# Patient Record
Sex: Male | Born: 2000 | Race: Black or African American | Hispanic: No | Marital: Single | Smoking: Current every day smoker
Health system: Southern US, Community
[De-identification: ages and names within clinical notes are randomized; demographics above are authoritative.]

## PROBLEM LIST (undated history)

## (undated) DIAGNOSIS — N433 Hydrocele, unspecified: Secondary | ICD-10-CM

## (undated) DIAGNOSIS — Z87820 Personal history of traumatic brain injury: Secondary | ICD-10-CM

---
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
## 2020-05-28 ENCOUNTER — Emergency Department (HOSPITAL_COMMUNITY)
Admission: EM | Admit: 2020-05-28 | Discharge: 2020-05-28 | Disposition: A | Discharge status: Home / Self Care | Payer: Self-pay | Attending: Emergency Medicine | Admitting: Emergency Medicine

## 2020-05-28 ENCOUNTER — Emergency Department (HOSPITAL_COMMUNITY): Payer: Self-pay

## 2020-05-28 ENCOUNTER — Other Ambulatory Visit: Payer: Self-pay

## 2020-05-28 DIAGNOSIS — F1924 Other psychoactive substance dependence with psychoactive substance-induced mood disorder: Secondary | ICD-10-CM | POA: Insufficient documentation

## 2020-05-28 DIAGNOSIS — F1994 Other psychoactive substance use, unspecified with psychoactive substance-induced mood disorder: Secondary | ICD-10-CM

## 2020-05-28 DIAGNOSIS — W19XXXA Unspecified fall, initial encounter: Secondary | ICD-10-CM

## 2020-05-28 DIAGNOSIS — J45909 Unspecified asthma, uncomplicated: Secondary | ICD-10-CM | POA: Insufficient documentation

## 2020-05-28 DIAGNOSIS — S060X1A Concussion with loss of consciousness of 30 minutes or less, initial encounter: Secondary | ICD-10-CM | POA: Insufficient documentation

## 2020-05-28 DIAGNOSIS — F332 Major depressive disorder, recurrent severe without psychotic features: Secondary | ICD-10-CM | POA: Insufficient documentation

## 2020-05-28 DIAGNOSIS — S0101XA Laceration without foreign body of scalp, initial encounter: Secondary | ICD-10-CM | POA: Insufficient documentation

## 2020-05-28 DIAGNOSIS — Z23 Encounter for immunization: Secondary | ICD-10-CM | POA: Insufficient documentation

## 2020-05-28 DIAGNOSIS — S0993XA Unspecified injury of face, initial encounter: Secondary | ICD-10-CM

## 2020-05-28 DIAGNOSIS — Z7951 Long term (current) use of inhaled steroids: Secondary | ICD-10-CM | POA: Insufficient documentation

## 2020-05-28 DIAGNOSIS — Z20822 Contact with and (suspected) exposure to covid-19: Secondary | ICD-10-CM | POA: Insufficient documentation

## 2020-05-28 DIAGNOSIS — F1010 Alcohol abuse, uncomplicated: Secondary | ICD-10-CM

## 2020-05-28 DIAGNOSIS — R45851 Suicidal ideations: Secondary | ICD-10-CM | POA: Insufficient documentation

## 2020-05-28 DIAGNOSIS — F329 Major depressive disorder, single episode, unspecified: Secondary | ICD-10-CM

## 2020-05-28 LAB — CBC
HCT: 47.9 % (ref 39.0–52.0)
Hemoglobin: 16.2 g/dL (ref 13.0–17.0)
MCH: 32.6 pg (ref 26.0–34.0)
MCHC: 33.8 g/dL (ref 30.0–36.0)
MCV: 96.4 fL (ref 80.0–100.0)
Platelets: 356 10*3/uL (ref 150–400)
RBC: 4.97 MIL/uL (ref 4.22–5.81)
RDW: 11.5 % (ref 11.5–15.5)
WBC: 11.4 10*3/uL — ABNORMAL HIGH (ref 4.0–10.5)
nRBC: 0 % (ref 0.0–0.2)

## 2020-05-28 LAB — ETHANOL: Alcohol, Ethyl (B): 212 mg/dL — ABNORMAL HIGH (ref <10)

## 2020-05-28 LAB — BASIC METABOLIC PANEL
Anion gap: 13 (ref 5–15)
BUN: 7 mg/dL (ref 6–20)
CO2: 18 mmol/L — ABNORMAL LOW (ref 22–32)
Calcium: 9.2 mg/dL (ref 8.9–10.3)
Chloride: 110 mmol/L (ref 98–111)
Creatinine, Ser: 1 mg/dL (ref 0.61–1.24)
GFR, Estimated: >60 mL/min (ref >60)
Glucose, Bld: 98 mg/dL (ref 70–99)
Potassium: 3.7 mmol/L (ref 3.5–5.1)
Sodium: 141 mmol/L (ref 135–145)

## 2020-05-28 LAB — RAPID URINE DRUG SCREEN, HOSP PERFORMED
Amphetamines: NOT DETECTED
Barbiturates: NOT DETECTED
Benzodiazepines: NOT DETECTED
Cocaine: NOT DETECTED
Opiates: NOT DETECTED
Tetrahydrocannabinol: NOT DETECTED

## 2020-05-28 LAB — RESP PANEL BY RT-PCR (FLU A&B, COVID) ARPGX2
Influenza A by PCR: NEGATIVE
Influenza B by PCR: NEGATIVE
SARS Coronavirus 2 by RT PCR: NEGATIVE

## 2020-05-28 MED ORDER — ACETAMINOPHEN 325 MG PO TABS: 650.0000 mg | ORAL_TABLET | Freq: Four times a day (QID) | ORAL | Status: DC | PRN

## 2020-05-28 MED ORDER — LORAZEPAM 1 MG PO TABS: 1.0000 mg | ORAL_TABLET | Freq: Four times a day (QID) | ORAL | Status: DC | PRN

## 2020-05-28 MED ORDER — TETANUS-DIPHTH-ACELL PERTUSSIS 5-2.5-18.5 LF-MCG/0.5 IM SUSY
0.5000 mL | PREFILLED_SYRINGE | Freq: Once | INTRAMUSCULAR | Status: AC
  Administered 2020-05-28: 0.5 mL via INTRAMUSCULAR
  Filled 2020-05-28: qty 0.5

## 2020-05-28 NOTE — ED Notes (Signed)
Pt mother, Lamar Laundry at bedside. Pt mother informed this RN and Dr. Bebe Shaggy that pt told sister he has thought about killing himself. To mother's knowledge, pt has not actively tried to harm himself, just drinks an excessive amount. Pt is resting at this time. Pt to see psych team in morning.
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
## 2020-05-28 NOTE — ED Notes (Signed)
Pt mother, Lamar Laundry, would like updates whenever possible. Number is in chart.

## 2020-05-28 NOTE — ED Notes (Signed)
Breakfast Ordered 

## 2020-05-28 NOTE — ED Notes (Signed)
Pt ambulates with steady gait and is drinking water without difficulty.

## 2020-05-28 NOTE — ED Notes (Signed)
Pt to CT

## 2020-05-28 NOTE — ED Notes (Signed)
Ice pack applied to right jaw . Patient was missing his phone , called his mother Lamar Laundry [phone] , she states she took his phone with her.

## 2020-05-28 NOTE — BH Assessment (Signed)
This Clinical research associate spoke to patient's father Conlee Sliter [phone] to gather collateral information with patient's permission in reference to safety concerns if patient were to be discharged. Father states patient has never attempted self harm to his knowledge and has no concerns in reference to patient being discharged this date. Father also reports he has no concerns in reference to patient harming anyone else. Father states patient currently resides with him and does report patient "drinks to much at times" although is uncertain if patient needs counseling but states if patient could be provided with resources on discharge he would encourage patient to follow up with OP care. Father states he will assist in transporting patient back to their residence on discharge.

## 2020-05-28 NOTE — Discharge Instructions (Addendum)
Consider following up with one of the facilities on the attached resource guide, for symptoms of depression, suicidal thoughts, or alcoholism.  Try to avoid all forms of alcohol.  Work on Chief Financial Officer.  Try to eat and drink regularly.

## 2020-05-28 NOTE — ED Triage Notes (Addendum)
Pt presents to ED POV. Pt c/o fall and ETOH. Per sister pt fell and hit back of head. Pt had ground level fall and hit concrete. Pt had bleeding from back of head, ear, and nose. Bleeding is controlled. Unknown LOC. Pt AAO x4, PERRLA.
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
## 2020-05-28 NOTE — ED Notes (Signed)
Pt is too lethargic to get TTS right now. Dr. Bebe Shaggy agrees that we should wait until pt is more awake.

## 2020-05-28 NOTE — ED Notes (Signed)
Pt is sound asleep. Unable to answer psych eval assessment questions at this time.

## 2020-05-28 NOTE — ED Provider Notes (Signed)
Emergency Medicine Observation Re-evaluation Note  Solmon Bohr III is a 20 y.o. male, seen on rounds today.  Pt initially presented to the ED for complaints of Fall Currently, the patient is calm and comfortable.  He has no further complaints.  Physical Exam  BP (!) 130/91 (BP Location: Left Arm)   Pulse 92   Temp 98.2 F (36.8 C) (Oral)   Resp 20   SpO2 96%  Physical Exam General: Alert and calm Cardiac: Normal heart rate Lungs: No respiratory distress Psych: Not responding to internal stimuli  ED Course / MDM  EKG:    I have reviewed the labs performed to date as well as medications administered while in observation.  Recent changes in the last 24 hours include he is calm and comfortable and has been seen by TTS who have cleared him psychiatrically.  We have contacted his family member will, pick him up.  He has been referred for care for alcoholism.  Plan  Current plan is for discharge home, with family member, father. Patient is not under full IVC at this time.   Mancel Bale, MD 05/28/20 [phone]

## 2022-05-17 ENCOUNTER — Emergency Department (HOSPITAL_COMMUNITY)
Admission: EM | Admit: 2022-05-17 | Discharge: 2022-05-19 | Disposition: A | Discharge status: Other Acute Inpatient Hospital | Payer: Federal, State, Local not specified - Other | Attending: Emergency Medicine | Admitting: Emergency Medicine

## 2022-05-17 ENCOUNTER — Other Ambulatory Visit: Payer: Self-pay

## 2022-05-17 ENCOUNTER — Encounter (HOSPITAL_COMMUNITY): Payer: Self-pay

## 2022-05-17 DIAGNOSIS — X781XXA Intentional self-harm by knife, initial encounter: Secondary | ICD-10-CM | POA: Insufficient documentation

## 2022-05-17 DIAGNOSIS — R4589 Other symptoms and signs involving emotional state: Secondary | ICD-10-CM | POA: Diagnosis present

## 2022-05-17 DIAGNOSIS — F1010 Alcohol abuse, uncomplicated: Secondary | ICD-10-CM | POA: Diagnosis present

## 2022-05-17 DIAGNOSIS — Z1152 Encounter for screening for COVID-19: Secondary | ICD-10-CM | POA: Insufficient documentation

## 2022-05-17 DIAGNOSIS — Z23 Encounter for immunization: Secondary | ICD-10-CM | POA: Insufficient documentation

## 2022-05-17 DIAGNOSIS — S61512A Laceration without foreign body of left wrist, initial encounter: Secondary | ICD-10-CM | POA: Insufficient documentation

## 2022-05-17 DIAGNOSIS — Z7289 Other problems related to lifestyle: Secondary | ICD-10-CM

## 2022-05-17 MED ORDER — ACETAMINOPHEN 500 MG PO TABS
1000.0000 mg | ORAL_TABLET | Freq: Once | ORAL | Status: AC
  Administered 2022-05-18: 1000 mg via ORAL
  Filled 2022-05-17: qty 2

## 2022-05-17 MED ORDER — LIDOCAINE-EPINEPHRINE (PF) 2 %-1:200000 IJ SOLN
10.0000 mL | Freq: Once | INTRAMUSCULAR | Status: AC
  Administered 2022-05-18: 10 mL
  Filled 2022-05-17: qty 20

## 2022-05-17 MED ORDER — TETANUS-DIPHTH-ACELL PERTUSSIS 5-2.5-18.5 LF-MCG/0.5 IM SUSY
0.5000 mL | PREFILLED_SYRINGE | Freq: Once | INTRAMUSCULAR | Status: AC
  Administered 2022-05-18: 0.5 mL via INTRAMUSCULAR
  Filled 2022-05-17: qty 0.5
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
## 2022-05-18 ENCOUNTER — Emergency Department (HOSPITAL_COMMUNITY): Payer: Self-pay

## 2022-05-18 DIAGNOSIS — Z7289 Other problems related to lifestyle: Secondary | ICD-10-CM

## 2022-05-18 DIAGNOSIS — F1010 Alcohol abuse, uncomplicated: Secondary | ICD-10-CM

## 2022-05-18 LAB — COMPREHENSIVE METABOLIC PANEL
ALT: 16 U/L (ref 0–44)
AST: 29 U/L (ref 15–41)
Albumin: 4.6 g/dL (ref 3.5–5.0)
Alkaline Phosphatase: 69 U/L (ref 38–126)
Anion gap: 15 (ref 5–15)
BUN: 8 mg/dL (ref 6–20)
CO2: 15 mmol/L — ABNORMAL LOW (ref 22–32)
Calcium: 8.6 mg/dL — ABNORMAL LOW (ref 8.9–10.3)
Chloride: 111 mmol/L (ref 98–111)
Creatinine, Ser: 0.96 mg/dL (ref 0.61–1.24)
GFR, Estimated: >60 mL/min (ref >60)
Glucose, Bld: 87 mg/dL (ref 70–99)
Potassium: 3.9 mmol/L (ref 3.5–5.1)
Sodium: 141 mmol/L (ref 135–145)
Total Bilirubin: 0.7 mg/dL (ref 0.3–1.2)
Total Protein: 8.1 g/dL (ref 6.5–8.1)

## 2022-05-18 LAB — CBC WITH DIFFERENTIAL/PLATELET
Abs Immature Granulocytes: 0.05 10*3/uL (ref 0.00–0.07)
Basophils Absolute: 0.1 10*3/uL (ref 0.0–0.1)
Basophils Relative: 1 %
Eosinophils Absolute: 0.1 10*3/uL (ref 0.0–0.5)
Eosinophils Relative: 1 %
HCT: 47.8 % (ref 39.0–52.0)
Hemoglobin: 16 g/dL (ref 13.0–17.0)
Immature Granulocytes: 1 %
Lymphocytes Relative: 22 %
Lymphs Abs: 2 10*3/uL (ref 0.7–4.0)
MCH: 33.3 pg (ref 26.0–34.0)
MCHC: 33.5 g/dL (ref 30.0–36.0)
MCV: 99.4 fL (ref 80.0–100.0)
Monocytes Absolute: 0.7 10*3/uL (ref 0.1–1.0)
Monocytes Relative: 8 %
Neutro Abs: 6.1 10*3/uL (ref 1.7–7.7)
Neutrophils Relative %: 67 %
Platelets: 386 10*3/uL (ref 150–400)
RBC: 4.81 MIL/uL (ref 4.22–5.81)
RDW: 12.5 % (ref 11.5–15.5)
WBC: 9.1 10*3/uL (ref 4.0–10.5)
nRBC: 0 % (ref 0.0–0.2)

## 2022-05-18 LAB — RESP PANEL BY RT-PCR (RSV, FLU A&B, COVID)  RVPGX2
Influenza A by PCR: NEGATIVE
Influenza B by PCR: NEGATIVE
Resp Syncytial Virus by PCR: NEGATIVE
SARS Coronavirus 2 by RT PCR: NEGATIVE

## 2022-05-18 LAB — SALICYLATE LEVEL: Salicylate Lvl: <7 mg/dL — ABNORMAL LOW (ref 7.0–30.0)

## 2022-05-18 LAB — ETHANOL: Alcohol, Ethyl (B): 265 mg/dL — ABNORMAL HIGH (ref <10)

## 2022-05-18 LAB — RAPID URINE DRUG SCREEN, HOSP PERFORMED
Amphetamines: NOT DETECTED
Barbiturates: NOT DETECTED
Benzodiazepines: NOT DETECTED
Cocaine: NOT DETECTED
Opiates: NOT DETECTED
Tetrahydrocannabinol: NOT DETECTED

## 2022-05-18 LAB — ACETAMINOPHEN LEVEL: Acetaminophen (Tylenol), Serum: <10 ug/mL — ABNORMAL LOW (ref 10–30)

## 2022-05-18 MED ORDER — ACETAMINOPHEN 325 MG PO TABS
650.0000 mg | ORAL_TABLET | Freq: Four times a day (QID) | ORAL | Status: DC | PRN
  Administered 2022-05-18 (×2): 650 mg via ORAL
  Filled 2022-05-18 (×2): qty 2

## 2022-05-18 MED ORDER — HYDROXYZINE HCL 25 MG PO TABS: 25.0000 mg | ORAL_TABLET | Freq: Four times a day (QID) | ORAL | Status: DC | PRN

## 2022-05-18 MED ORDER — LORAZEPAM 1 MG PO TABS: 1.0000 mg | ORAL_TABLET | Freq: Once | ORAL | Status: DC | PRN

## 2022-05-18 MED ORDER — LORAZEPAM 1 MG PO TABS
1.0000 mg | ORAL_TABLET | ORAL | Status: AC
  Administered 2022-05-18: 1 mg via ORAL
  Filled 2022-05-18: qty 1

## 2022-05-18 MED ORDER — BACITRACIN ZINC 500 UNIT/GM EX OINT
TOPICAL_OINTMENT | Freq: Two times a day (BID) | CUTANEOUS | Status: DC
  Administered 2022-05-18: 1 via TOPICAL
  Filled 2022-05-18 (×2): qty 0.9

## 2022-05-18 MED ORDER — TRAZODONE HCL 50 MG PO TABS: 50.0000 mg | ORAL_TABLET | Freq: Once | ORAL | Status: DC | PRN

## 2022-05-18 NOTE — ED Notes (Signed)
Sent note to pharmacy for Bacitracin.  None in pixis in ED or Triage

## 2022-05-18 NOTE — ED Provider Notes (Addendum)
[place] [facility] DEPT Provider Note   CSN: [id number] Arrival date & time: 05/17/22  2205     History  Chief Complaint  Patient presents with   Laceration    Ethan Beasley is a 22 y.o. male.   Laceration Patient has a laceration to the left wrist that was self-inflicted.  Seems that patient has been quite depressed per his father who I talked on the phone.  Seems that he has had some job issues and also suffers from depression and alcoholism.  Was drinking several 40s today over the course of the day.  Seems he was intoxicated earlier and took a small paring knife and cut the back of his hand and wrist.  EMS arrived and brought patient to the ER.  Patient is voluntary at this time but states he would like to see psychiatry while he is here.  Denies any numbness or weakness.  Denies any other areas of injury.     Home Medications Prior to Admission medications   Medication Sig Start Date End Date Taking? Authorizing Provider  albuterol (PROAIR HFA) 108 (90 Base) MCG/ACT inhaler Inhale two puffs every four to six hours as needed for cough or wheeze. 08/15/16   Kozlow, Donnamarie Poag, MD  budesonide-formoterol [facility]) 160-4.5 MCG/ACT inhaler Inhale two puffs twice daily as directed to prevent cough or wheeze. Rinse, gargle, and spit after use. 08/15/16   Kozlow, Donnamarie Poag, MD  cetirizine (ZYRTEC) 10 MG tablet Take 1 tablet (10 mg total) by mouth daily. 08/15/16   Kozlow, Donnamarie Poag, MD  fluticasone (FLONASE) 50 MCG/ACT nasal spray Use one spray in each nostril twice daily to prevent runny nose and congestion. 08/15/16   Kozlow, Donnamarie Poag, MD  montelukast (SINGULAIR) 10 MG tablet Take one tablet once daily as directed 08/15/16   Kozlow, Donnamarie Poag, MD      Allergies    Aspirin, Penicillins, Amoxicillin, Cephalosporins, and Nsaids    Review of Systems   Review of Systems  Physical Exam Updated Vital Signs BP (!) 148/84   Pulse (!) 113   Temp 98 F (36.7 C) (Oral)    Resp 16   Ht 5\' 8"  (1.727 m)   Wt 72.6 kg   SpO2 94%   BMI 24.33 kg/m  Physical Exam Vitals and nursing note reviewed.  Constitutional:      General: He is not in acute distress. HENT:     Head: Normocephalic and atraumatic.     Nose: Nose normal.  Eyes:     General: No scleral icterus. Cardiovascular:     Rate and Rhythm: Normal rate and regular rhythm.     Pulses: Normal pulses.     Heart sounds: Normal heart sounds.  Pulmonary:     Effort: Pulmonary effort is normal. No respiratory distress.     Breath sounds: No wheezing.  Abdominal:     Palpations: Abdomen is soft.     Tenderness: There is no abdominal tenderness.  Musculoskeletal:     Cervical back: Normal range of motion.     Right lower leg: No edema.     Left lower leg: No edema.     Comments: Large laceration to the dorsum of the wrist that violates the fascia but terminates in the muscle body.  Full range of motion of left wrist flexion extension.  No weakness able to flex and extend fingers against resistance.  Cap refill less than 2 seconds in all fingertips  Radial artery pulses 3+ and  symmetric  Skin:    General: Skin is warm and dry.     Capillary Refill: Capillary refill takes less than 2 seconds.     Comments: Additional small approximate 1 cm laceration to the dorsum of the hand.  Neurological:     Mental Status: He is alert. Mental status is at baseline.  Psychiatric:        Behavior: Behavior normal.     Comments: Mostly normal behavior, depressed     ED Results / Procedures / Treatments   Labs (all labs ordered are listed, but only abnormal results are displayed) Labs Reviewed  COMPREHENSIVE METABOLIC PANEL - Abnormal; Notable for the following components:      Result Value   CO2 15 (*)    Calcium 8.6 (*)    All other components within normal limits  ETHANOL - Abnormal; Notable for the following components:   Alcohol, Ethyl (B) 265 (*)    All other components within normal limits   ACETAMINOPHEN LEVEL - Abnormal; Notable for the following components:   Acetaminophen (Tylenol), Serum <10 (*)    All other components within normal limits  SALICYLATE LEVEL - Abnormal; Notable for the following components:   Salicylate Lvl <4.0 (*)    All other components within normal limits  RESP PANEL BY RT-PCR (RSV, FLU A&B, COVID)  RVPGX2  CBC WITH DIFFERENTIAL/PLATELET  RAPID URINE DRUG SCREEN, HOSP PERFORMED    EKG None  Radiology DG Wrist Complete Left  Result Date: 05/18/2022 CLINICAL DATA:  Laceration along the ulnar side of the wrist, initial encounter EXAM: LEFT WRIST - COMPLETE 3+ VIEW COMPARISON:  None Available. FINDINGS: There is no evidence of fracture or dislocation. There is no evidence of arthropathy or other focal bone abnormality. Soft tissues are unremarkable. IMPRESSION: No acute abnormality noted. Electronically Signed   By: Inez Catalina M.D.   On: 05/18/2022 01:32    Procedures .Marland KitchenLaceration Repair  Date/Time: 05/18/2022 7:14 AM  Performed by: Tedd Sias, PA Authorized by: Tedd Sias, PA   Consent:    Consent obtained:  Verbal   Consent given by:  Parent and patient   Risks discussed:  Infection, pain, poor cosmetic result and poor wound healing Universal protocol:    Procedure explained and questions answered to patient or proxy's satisfaction: yes     Relevant documents present and verified: yes     Test results available: yes     Imaging studies available: yes     Required blood products, implants, devices, and special equipment available: yes     Site/side marked: yes     Immediately prior to procedure, a time out was called: yes     Patient identity confirmed:  Verbally with patient and arm band Laceration details:    Location: L wrist.   Length (cm):  6 Exploration:    Hemostasis achieved with:  Epinephrine   Imaging obtained: x-ray     Imaging outcome: foreign body not noted     Wound extent: areolar tissue violated, fascia  violated and muscle damage     Wound extent: no tendon damage and no underlying fracture     Contaminated: no   Treatment:    Amount of cleaning:  Standard   Irrigation solution:  Sterile saline   Irrigation volume:  Copious   Irrigation method:  Pressure wash and syringe   Visualized foreign bodies/material removed: no     Layers/structures repaired:  Deep dermal/superficial fascia Deep dermal/superficial fascia:  Suture size:  5-0   Suture material:  Vicryl   Deep dermal/superficial fascia suture technique: simple interrupted.   Number of sutures:  2 Skin repair:    Repair method:  Sutures   Suture size:  5-0   Suture material:  Prolene   Suture technique:  Simple interrupted   Number of sutures:  9 Approximation:    Approximation:  Close Repair type:    Repair type:  Complex Post-procedure details:    Dressing:  Antibiotic ointment and non-adherent dressing   Procedure completion:  Tolerated .Marland KitchenLaceration Repair  Date/Time: 05/18/2022 7:19 AM  Performed by: Tedd Sias, PA Authorized by: Tedd Sias, PA   Consent:    Consent obtained:  Verbal   Consent given by:  Parent and patient   Risks discussed:  Infection, pain, poor cosmetic result and poor wound healing Universal protocol:    Procedure explained and questions answered to patient or proxy's satisfaction: yes     Relevant documents present and verified: yes     Test results available: yes     Imaging studies available: yes     Required blood products, implants, devices, and special equipment available: yes     Site/side marked: yes     Immediately prior to procedure, a time out was called: yes     Patient identity confirmed:  Verbally with patient and arm band Laceration details:    Location:  Hand   Hand location:  L hand, dorsum   Length (cm):  1 Exploration:    Hemostasis achieved with:  Epinephrine   Imaging obtained: x-ray     Imaging outcome: foreign body not noted     Wound extent:  areolar tissue violated     Wound extent: fascia not violated, no foreign body and no signs of injury     Contaminated: no   Treatment:    Amount of cleaning:  Standard   Irrigation solution:  Sterile saline   Irrigation volume:  Copious   Visualized foreign bodies/material removed: no   Skin repair:    Repair method:  Sutures   Suture size:  5-0   Suture material:  Prolene   Suture technique:  Simple interrupted   Number of sutures:  1 Approximation:    Approximation:  Close Repair type:    Repair type:  Simple Post-procedure details:    Dressing:  Antibiotic ointment   Procedure completion:  Tolerated     Medications Ordered in ED Medications  bacitracin ointment (has no administration in time range)  lidocaine-EPINEPHrine (XYLOCAINE W/EPI) 2 %-1:200000 (PF) injection 10 mL (10 mLs Infiltration Given 05/18/22 0018)  Tdap (BOOSTRIX) injection 0.5 mL (0.5 mLs Intramuscular Given 05/18/22 0017)  acetaminophen (TYLENOL) tablet 1,000 mg (1,000 mg Oral Given 05/18/22 0016)    ED Course/ Medical Decision Making/ A&P                             Medical Decision Making Amount and/or Complexity of Data Reviewed Labs: ordered. Radiology: ordered.  Risk OTC drugs. Prescription drug management.   This patient presents to the ED for concern of self injury, this involves a number of treatment options, and is a complaint that carries with it a high risk of complications and morbidity.     Co morbidities: Discussed in HPI   Brief History:  Patient has a laceration to the left wrist that was self-inflicted.  Seems that patient has been quite depressed  per his father who I talked on the phone.  Seems that he has had some job issues and also suffers from depression and alcoholism.  Was drinking several 40s today over the course of the day.  Seems he was intoxicated earlier and took a small paring knife and cut the back of his hand and wrist.  EMS arrived and brought patient to the  ER.  Patient is voluntary at this time but states he would like to see psychiatry while he is here.  Denies any numbness or weakness.  Denies any other areas of injury.    EMR reviewed including pt PMHx, past surgical history and past visits to ER.   See HPI for more details   Lab Tests:   I personally reviewed all laboratory work and imaging. Metabolic panel without any acute abnormality specifically kidney function within normal limits and no significant electrolyte abnormalities. CBC without leukocytosis or significant anemia. Does have alcohol present in the system  Imaging Studies:  NAD. I personally reviewed all imaging studies and no acute abnormality found. I agree with radiology interpretation.    Cardiac Monitoring:  NA NA   Medicines ordered:  I ordered medication including lidocaine, tetanus, Tylenol, bacitracin ointment for pain wound Reevaluation of the patient after these medicines showed that the patient improved I have reviewed the patients home medicines and have made adjustments as needed   Critical Interventions:     Consults/Attending Physician      Reevaluation:  After the interventions noted above I re-evaluated patient and found that they have :improved   Social Determinants of Health:      Problem List / ED Course:  Self-inflicted wounds.  Currently voluntary.  Will need to go to TTS. Lacerations with a total of 10 superficial sutures that will need to be removed in 10 days.  Dispostion:  After consideration of the diagnostic results and the patients response to treatment, I feel that the patent would benefit from evaluation by psychiatry.  He is medically cleared and awaiting TTS consultation for disposition.  Final Clinical Impression(s) / ED Diagnoses Final diagnoses:  Laceration of left wrist, initial encounter  Self-injurious behavior    Rx / DC Orders ED Discharge Orders     None         Gailen Shelter, [place] 05/18/22 0723    Gailen Shelter, PA 05/18/22 2341    Gilda Crease, MD 05/19/22 [phone]

## 2022-05-18 NOTE — Progress Notes (Signed)
Pt was accepted to [facility] 05/18/22; Bed Assignment 300-1  [id number] abuse  Pt meets inpatient criteria per Michaele Offer, [place]  Attending Physician will be Dr. Janine Limbo  Report can be called to: Adult unit: [phone]  Pt can arrive after [place] Team notified: [facility] Clayborne Dana, RN, Princella Ion, RN, Michaele Offer, PMHNP, Jeanie Sewer, RN, [address], RN  [place], [place] 05/18/2022 @ 10:25 PM

## 2022-05-18 NOTE — ED Notes (Signed)
Pt belongings moved to nurse's station cabinet 9-12. Pt has two bags.

## 2022-05-18 NOTE — ED Notes (Signed)
Pt received dinner tray ate 100%

## 2022-05-18 NOTE — ED Notes (Signed)
[facility] [employer] called to transport IVC pt to [address] room 300-1

## 2022-05-18 NOTE — Discharge Instructions (Addendum)
Sutured repair Keep the laceration site dry for the next 24 hours and leave the dressing in place. After 24 hours you may remove the dressing and gently clean the laceration site with antibacterial soap and warm water. Do not scrub the area. Do not soak the area and water for long periods of time. Don't use hydrogen peroxide, iodine-based solutions, or alcohol, which can slow healing, and will probably be painful! Apply topical bacitracin 1-2 times per day for the next 3-5 days. Return to the emergency department in 10 days for removal of the sutures.  You should return sooner for any signs of infection which would include increased redness around the wound, increased swelling, new drainage of yellow pus.    Discharge recommendations:  Patient is to take medications as prescribed. Please see information for follow-up appointment with psychiatry and therapy. Please follow up with your primary care provider for all medical related needs.   Therapy: We recommend that patient participate in individual therapy to address mental health concerns.  Medications: The patient or guardian is to contact a medical professional and/or outpatient provider to address any new side effects that develop. The patient or guardian should update outpatient providers of any new medications and/or medication changes.   Atypical antipsychotics: If you are prescribed an atypical antipsychotic, it is recommended that your height, weight, BMI, blood pressure, fasting lipid panel, and fasting blood sugar be monitored by your outpatient providers.  Safety:  The patient should abstain from use of illicit substances/drugs and abuse of any medications. If symptoms worsen or do not continue to improve or if the patient becomes actively suicidal or homicidal then it is recommended that the patient return to the closest hospital emergency department, the [facility], or call 911 for further evaluation and  treatment. National Suicide Prevention Lifeline 1-800-SUICIDE or 407-138-6880.  About 988 988 offers 24/7 access to trained crisis counselors who can help people experiencing mental health-related distress. People can call or text 988 or chat 988lifeline.org for themselves or if they are worried about a loved one who may need crisis support.  Crisis Mobile: [facility]:                     217-838-9794 (for crisis response 24 hours a day) [place]:                                            ([phone]) [phone]
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
## 2022-05-18 NOTE — ED Notes (Signed)
Pt making phone call to family

## 2022-05-19 ENCOUNTER — Encounter (HOSPITAL_COMMUNITY): Payer: Self-pay | Admitting: Psychiatry

## 2022-05-19 ENCOUNTER — Inpatient Hospital Stay (HOSPITAL_COMMUNITY)
Admission: AD | Admit: 2022-05-19 | Discharge: 2022-05-23 | DRG: 885 | Disposition: A | Discharge status: Home / Self Care | Payer: Federal, State, Local not specified - Other | Source: Intra-hospital | Attending: Psychiatry | Admitting: Psychiatry

## 2022-05-19 ENCOUNTER — Other Ambulatory Visit: Payer: Self-pay

## 2022-05-19 DIAGNOSIS — F1010 Alcohol abuse, uncomplicated: Secondary | ICD-10-CM | POA: Diagnosis present

## 2022-05-19 DIAGNOSIS — F1020 Alcohol dependence, uncomplicated: Secondary | ICD-10-CM | POA: Diagnosis present

## 2022-05-19 DIAGNOSIS — F17210 Nicotine dependence, cigarettes, uncomplicated: Secondary | ICD-10-CM | POA: Diagnosis present

## 2022-05-19 DIAGNOSIS — R45851 Suicidal ideations: Secondary | ICD-10-CM | POA: Diagnosis present

## 2022-05-19 DIAGNOSIS — Z79899 Other long term (current) drug therapy: Secondary | ICD-10-CM | POA: Diagnosis not present

## 2022-05-19 DIAGNOSIS — Z20822 Contact with and (suspected) exposure to covid-19: Secondary | ICD-10-CM | POA: Diagnosis present

## 2022-05-19 DIAGNOSIS — Z91199 Patient's noncompliance with other medical treatment and regimen due to unspecified reason: Secondary | ICD-10-CM | POA: Diagnosis not present

## 2022-05-19 DIAGNOSIS — Z23 Encounter for immunization: Secondary | ICD-10-CM

## 2022-05-19 DIAGNOSIS — F17200 Nicotine dependence, unspecified, uncomplicated: Secondary | ICD-10-CM | POA: Diagnosis present

## 2022-05-19 DIAGNOSIS — G4700 Insomnia, unspecified: Secondary | ICD-10-CM | POA: Diagnosis present

## 2022-05-19 DIAGNOSIS — Y908 Blood alcohol level of 240 mg/100 ml or more: Secondary | ICD-10-CM | POA: Diagnosis present

## 2022-05-19 DIAGNOSIS — F332 Major depressive disorder, recurrent severe without psychotic features: Secondary | ICD-10-CM | POA: Diagnosis present

## 2022-05-19 DIAGNOSIS — J45909 Unspecified asthma, uncomplicated: Secondary | ICD-10-CM | POA: Diagnosis not present

## 2022-05-19 DIAGNOSIS — Z87820 Personal history of traumatic brain injury: Secondary | ICD-10-CM

## 2022-05-19 DIAGNOSIS — Z818 Family history of other mental and behavioral disorders: Secondary | ICD-10-CM | POA: Diagnosis not present

## 2022-05-19 DIAGNOSIS — I10 Essential (primary) hypertension: Secondary | ICD-10-CM | POA: Diagnosis present

## 2022-05-19 DIAGNOSIS — Z8709 Personal history of other diseases of the respiratory system: Secondary | ICD-10-CM

## 2022-05-19 DIAGNOSIS — F411 Generalized anxiety disorder: Secondary | ICD-10-CM | POA: Diagnosis present

## 2022-05-19 LAB — URINALYSIS, COMPLETE (UACMP) WITH MICROSCOPIC
Bacteria, UA: NONE SEEN
Bilirubin Urine: NEGATIVE
Glucose, UA: NEGATIVE mg/dL
Hgb urine dipstick: NEGATIVE
Ketones, ur: NEGATIVE mg/dL
Leukocytes,Ua: NEGATIVE
Nitrite: NEGATIVE
Protein, ur: NEGATIVE mg/dL
Specific Gravity, Urine: 1.027 (ref 1.005–1.030)
pH: 6 (ref 5.0–8.0)

## 2022-05-19 MED ORDER — NICOTINE 21 MG/24HR TD PT24
21.0000 mg | MEDICATED_PATCH | Freq: Every day | TRANSDERMAL | Status: DC
  Filled 2022-05-19 (×5): qty 1

## 2022-05-19 MED ORDER — HYDROXYZINE HCL 25 MG PO TABS
25.0000 mg | ORAL_TABLET | Freq: Three times a day (TID) | ORAL | Status: DC | PRN
  Administered 2022-05-20: 25 mg via ORAL
  Filled 2022-05-19: qty 1
  Filled 2022-05-19: qty 10

## 2022-05-19 MED ORDER — LORATADINE 10 MG PO TABS
10.0000 mg | ORAL_TABLET | Freq: Every day | ORAL | Status: DC
  Administered 2022-05-19 – 2022-05-23 (×5): 10 mg via ORAL
  Filled 2022-05-19 (×6): qty 1

## 2022-05-19 MED ORDER — ALBUTEROL SULFATE HFA 108 (90 BASE) MCG/ACT IN AERS: 2.0000 | INHALATION_SPRAY | Freq: Four times a day (QID) | RESPIRATORY_TRACT | Status: DC | PRN

## 2022-05-19 MED ORDER — LORAZEPAM 1 MG PO TABS
1.0000 mg | ORAL_TABLET | Freq: Every day | ORAL | Status: AC
  Administered 2022-05-23: 1 mg via ORAL
  Filled 2022-05-19: qty 1

## 2022-05-19 MED ORDER — LORAZEPAM 1 MG PO TABS
1.0000 mg | ORAL_TABLET | Freq: Two times a day (BID) | ORAL | Status: AC
  Administered 2022-05-21 – 2022-05-22 (×2): 1 mg via ORAL
  Filled 2022-05-19 (×2): qty 1

## 2022-05-19 MED ORDER — ACETAMINOPHEN 325 MG PO TABS
650.0000 mg | ORAL_TABLET | Freq: Four times a day (QID) | ORAL | Status: DC | PRN
  Administered 2022-05-20 – 2022-05-21 (×2): 650 mg via ORAL
  Filled 2022-05-19 (×2): qty 2

## 2022-05-19 MED ORDER — INFLUENZA VAC SPLIT QUAD 0.5 ML IM SUSY
0.5000 mL | PREFILLED_SYRINGE | INTRAMUSCULAR | Status: AC
  Administered 2022-05-20: 0.5 mL via INTRAMUSCULAR
  Filled 2022-05-19: qty 0.5

## 2022-05-19 MED ORDER — HYDROXYZINE HCL 25 MG PO TABS: 25.0000 mg | ORAL_TABLET | Freq: Four times a day (QID) | ORAL | Status: AC | PRN

## 2022-05-19 MED ORDER — LORAZEPAM 1 MG PO TABS
1.0000 mg | ORAL_TABLET | Freq: Four times a day (QID) | ORAL | Status: AC
  Administered 2022-05-19 – 2022-05-20 (×4): 1 mg via ORAL
  Filled 2022-05-19 (×4): qty 1

## 2022-05-19 MED ORDER — THIAMINE HCL 100 MG/ML IJ SOLN
100.0000 mg | Freq: Once | INTRAMUSCULAR | Status: AC
  Administered 2022-05-19: 100 mg via INTRAMUSCULAR
  Filled 2022-05-19: qty 2

## 2022-05-19 MED ORDER — LORAZEPAM 1 MG PO TABS
1.0000 mg | ORAL_TABLET | Freq: Three times a day (TID) | ORAL | Status: AC
  Administered 2022-05-20 – 2022-05-21 (×3): 1 mg via ORAL
  Filled 2022-05-19 (×3): qty 1

## 2022-05-19 MED ORDER — TRAZODONE HCL 50 MG PO TABS
50.0000 mg | ORAL_TABLET | Freq: Once | ORAL | Status: AC | PRN
  Administered 2022-05-19: 50 mg via ORAL
  Filled 2022-05-19: qty 1

## 2022-05-19 MED ORDER — ALUM & MAG HYDROXIDE-SIMETH 200-200-20 MG/5ML PO SUSP: 30.0000 mL | ORAL | Status: DC | PRN

## 2022-05-19 MED ORDER — SERTRALINE HCL 50 MG PO TABS
50.0000 mg | ORAL_TABLET | Freq: Every day | ORAL | Status: DC
  Administered 2022-05-20 – 2022-05-21 (×2): 50 mg via ORAL
  Filled 2022-05-19 (×4): qty 1

## 2022-05-19 MED ORDER — ADULT MULTIVITAMIN W/MINERALS CH
1.0000 | ORAL_TABLET | Freq: Every day | ORAL | Status: DC
  Administered 2022-05-19 – 2022-05-23 (×5): 1 via ORAL
  Filled 2022-05-19 (×7): qty 1

## 2022-05-19 MED ORDER — VITAMIN B-1 100 MG PO TABS
100.0000 mg | ORAL_TABLET | Freq: Every day | ORAL | Status: DC
  Administered 2022-05-20 – 2022-05-23 (×4): 100 mg via ORAL
  Filled 2022-05-19 (×6): qty 1

## 2022-05-19 MED ORDER — MAGNESIUM HYDROXIDE 400 MG/5ML PO SUSP: 30.0000 mL | Freq: Every day | ORAL | Status: DC | PRN

## 2022-05-19 MED ORDER — LOPERAMIDE HCL 2 MG PO CAPS: 2.0000 mg | ORAL_CAPSULE | ORAL | Status: AC | PRN

## 2022-05-19 MED ORDER — ONDANSETRON 4 MG PO TBDP: 4.0000 mg | ORAL_TABLET | Freq: Four times a day (QID) | ORAL | Status: AC | PRN

## 2022-05-19 NOTE — Tx Team (Signed)
Initial Treatment Plan 05/19/2022 1:33 AM Ethan Beasley [id number]    PATIENT STRESSORS: Substance abuse     PATIENT STRENGTHS: Ability for insight  Average or above average intelligence  Physical Health  Supportive family/friends    PATIENT IDENTIFIED PROBLEMS: "Over thinking the past and the future"  ETOH use  Lack of coping skills                 DISCHARGE CRITERIA:  Ability to meet basic life and health needs Safe-care adequate arrangements made Withdrawal symptoms are absent or subacute and managed without 24-hour nursing intervention  PRELIMINARY DISCHARGE PLAN: Attend aftercare/continuing care group Attend 12-step recovery group  PATIENT/FAMILY INVOLVEMENT: This treatment plan has been presented to and reviewed with the patient, Ethan Beasley. The patient has been given the opportunity to ask questions and make suggestions.  Lance Bosch, RN 05/19/2022, 1:33 AM

## 2022-05-19 NOTE — Progress Notes (Signed)
   05/19/22 0553  15 Minute Checks  Location Bedroom  Visual Appearance Calm  Behavior Sleeping  Sleep (Behavioral Health Patients Only)  Calculate sleep? (Click Yes once per 24 hr at 0600 safety check) Yes  Documented sleep last 24 hours 4
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
## 2022-05-19 NOTE — Progress Notes (Signed)
[facility] Notes:  (Nursing/MHT/Case Management/Adjunct)  Date:  05/19/2022  Time:  04/12/99  Type of Therapy:   wrap up group  Participation Level:  Active  Participation Quality:  Appropriate, Attentive, Sharing, and Supportive  Affect:  Depressed  Cognitive:  Alert  Insight:  Improving  Engagement in Group:  Developing/Improving  Modes of Intervention:  Clarification, Education, and Support  Summary of Progress/Problems: Positive thinking and positive change were discussed.   Shellia Cleverly 05/19/2022, 10:41 PM

## 2022-05-19 NOTE — [facility] Suicide Risk Assessment (Signed)
Suicide Risk Assessment  Admission Assessment    [facility] Admission Suicide Risk Assessment   Nursing information obtained from:  Patient Demographic factors:  Male, Adolescent or young adult Current Mental Status:  Depressed mood Loss Factors:  NA Historical Factors:  Previous hospitalization, alcohol use d/o, medication non compliance Risk Reduction Factors:  Living with another person, especially a relative, Sense of responsibility to family, Positive social support  Total Time spent with patient: 1.5 hours Principal Problem: MDD (major depressive disorder), recurrent severe, without psychosis ([place]) Diagnosis:  Principal Problem:   MDD (major depressive disorder), recurrent severe, without psychosis ([place]) Active Problems:   Severe alcohol dependence ([place])   Anxiety state   Insomnia   History of asthma   Nicotine dependence  Subjective Data: alcohol use "as a way to cope".  Continued Clinical Symptoms: Depressed mood, alcohol use which leads to dangerous behaviors during periods of blackouts and intoxication. Pt is in need of continuous hospitalization for treatment and stabilization of current mental status.   Alcohol Use Disorder Identification Test Final Score (AUDIT): 31 The "Alcohol Use Disorders Identification Test", Guidelines for Use in Primary Care, Second Edition.  World Pharmacologist [facility]). Score between 0-7:  no or low risk or alcohol related problems. Score between 8-15:  moderate risk of alcohol related problems. Score between 16-19:  high risk of alcohol related problems. Score 20 or above:  warrants further diagnostic evaluation for alcohol dependence and treatment.  CLINICAL FACTORS:   Depression:   Anhedonia Hopelessness Insomnia Severe Alcohol/Substance Abuse/Dependencies More than one psychiatric diagnosis Previous Psychiatric Diagnoses and Treatments  Musculoskeletal: Strength & Muscle Tone: within normal limits Gait & Station: normal Patient  leans: N/A  Psychiatric Specialty Exam:  Presentation  General Appearance:  Disheveled  Eye Contact: Fair  Speech: Clear and Coherent  Speech Volume: Normal  Handedness: Right   Mood and Affect  Mood: Depressed  Affect: Congruent   Thought Process  Thought Processes: Coherent  Descriptions of Associations:Intact  Orientation:Full (Time, Place and Person)  Thought Content:Logical  History of Schizophrenia/Schizoaffective disorder:No data recorded Duration of Psychotic Symptoms:No data recorded Hallucinations:Hallucinations: None  Ideas of Reference:None  Suicidal Thoughts:Suicidal Thoughts: No  Homicidal Thoughts:Homicidal Thoughts: No   Sensorium  Memory: Immediate Good  Judgment: Fair  Insight: Fair   Community education officer  Concentration: Fair  Attention Span: Good  Recall: [employer] of Knowledge: Fair  Language: Fair  Psychomotor Activity  Psychomotor Activity: Psychomotor Activity: Normal  Assets  Assets: Communication Skills  Sleep  Sleep: Sleep: Fair  Physical Exam: Physical Exam Constitutional:      Appearance: Normal appearance.  HENT:     Head: Normocephalic.  Eyes:     Pupils: Pupils are equal, round, and reactive to light.  Pulmonary:     Effort: Pulmonary effort is normal.  Musculoskeletal:        General: Normal range of motion.  Neurological:     Mental Status: He is alert and oriented to person, place, and time.    Review of Systems  Constitutional: Negative.   HENT: Negative.    Respiratory: Negative.    Cardiovascular: Negative.   Gastrointestinal: Negative.   Genitourinary: Negative.   Skin: Negative.   Neurological: Negative.   Psychiatric/Behavioral:  Positive for depression and substance abuse. Negative for hallucinations, memory loss and suicidal ideas. The patient is nervous/anxious and has insomnia.    Blood pressure (!) 141/96, pulse 91, temperature 98.6 F (37 C), resp. rate  18, height 5\' 11"  (1.803 m),  weight 77.1 kg, SpO2 99 %. Body mass index is 23.71 kg/m.   COGNITIVE FEATURES THAT CONTRIBUTE TO RISK:  None    SUICIDE RISK:    Moderate: Patient is male, young, low socioeconomic status, has a history of alcohol use disorder, has had previous hospitalization for mental health related reasons, has multiple blackouts in the past.  Patient's behavior has during periods of blackouts such as cutting himself in the wrist renders him a danger to self, and continues hospitalization is required to treat and stabilize current mood.  PLAN OF CARE: Please See H & P  I certify that inpatient services furnished can reasonably be expected to improve the patient's condition.   Nicholes Rough, NP 05/19/2022, 2:16 PM

## 2022-05-19 NOTE — Progress Notes (Addendum)
Pt is A&OX4, calm, flat, isolative, in bed all day, denies suicidal ideations, denies homicidal ideations, denies auditory hallucinations and denies visual hallucinations. Pt verbally agrees to approach staff if these become apparent and before harming self or others. Pt denies experiencing nightmares. Mood and affect are congruent. Pt appetite is ok. No complaints of anxiety, distress, pain and/or discomfort at this time. Pt's memory appears to be grossly intact, and Pt hasn't displayed any injurious behaviors. Pt is medication compliant. Pt is on alcohol protocol. Pt received Thiamine injection today via Probation officer. Pt tolerated well. No adverse reaction(s) noted. There's no evidence of suicidal intent. Psychomotor activity was WNL. No s/s of Parkinson, Dystonia, Akathisia and/or Tardive Dyskinesia noted.

## 2022-05-19 NOTE — ED Notes (Addendum)
Pt transported via [employer] officers x2  along with 2 bags of belongings condition stable affect flat. Drsg changed to left anterior wrist and left anterior  index finger knuckle no drainage noted a sterile telfa and sterile gauze 4x4,'s applied with adhesive tape.

## 2022-05-19 NOTE — H&P (Signed)
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 maintain clinical measurements within normal limits will improve, Compliance with prescribed medications will improve, and Ability to identify triggers associated with substance abuse/mental health issues will improve  Discharge Planning: Social work and case management to assist with discharge planning and identification of hospital follow-up needs prior to discharge Estimated LOS: 5-7 days Discharge Concerns: Need to establish a safety plan; Medication compliance and effectiveness Discharge Goals: Return home with outpatient  referrals for mental health follow-up including medication management/psychotherapy  I certify that inpatient services furnished can reasonably be expected to improve the patient's condition.    Starleen Blue, NP 1/20/20242:05 PM

## 2022-05-19 NOTE — Group Note (Signed)
LCSW Group Therapy Note  Group Date: 05/19/2022 Start Time: 1010 End Time: 1110   Type of Therapy and Topic:  Group Therapy: Anger Cues and Responses  Participation Level:  Did Not Attend   Description of Group:   In this group, patients learned how to recognize the physical, cognitive, emotional, and behavioral responses they have to anger-provoking situations.  They identified a recent time they became angry and how they reacted.  They analyzed how their reaction was possibly beneficial and how it was possibly unhelpful.  The group discussed a variety of healthier coping skills that could help with such a situation in the future.  Focus was placed on how helpful it is to recognize the underlying emotions to our anger, because working on those can lead to a more permanent solution as well as our ability to focus on the important rather than the urgent.  Therapeutic Goals: Patients will remember their last incident of anger and how they felt emotionally and physically, what their thoughts were at the time, and how they behaved. Patients will identify how their behavior at that time worked for them, as well as how it worked against them. Patients will explore possible new behaviors to use in future anger situations. Patients will learn that anger itself is normal and cannot be eliminated, and that healthier reactions can assist with resolving conflict rather than worsening situations.  Summary of Patient Progress:  Patient was invited to group, did not attend.   Therapeutic Modalities:   Cognitive Behavioral Therapy    Marquette Old 05/19/2022  2:36 PM

## 2022-05-20 LAB — HEMOGLOBIN A1C
Hgb A1c MFr Bld: 4.5 % — ABNORMAL LOW (ref 4.8–5.6)
Mean Plasma Glucose: 82.45 mg/dL

## 2022-05-20 LAB — LIPID PANEL
Cholesterol: 194 mg/dL (ref 0–200)
HDL: 102 mg/dL (ref >40)
LDL Cholesterol: 73 mg/dL (ref 0–99)
Total CHOL/HDL Ratio: 1.9 RATIO
Triglycerides: 94 mg/dL (ref <150)
VLDL: 19 mg/dL (ref 0–40)

## 2022-05-20 LAB — TSH: TSH: 2.76 u[IU]/mL (ref 0.350–4.500)

## 2022-05-20 LAB — VITAMIN B12: Vitamin B-12: 138 pg/mL — ABNORMAL LOW (ref 180–914)

## 2022-05-20 MED ORDER — BACITRACIN-NEOMYCIN-POLYMYXIN OINTMENT TUBE
TOPICAL_OINTMENT | Freq: Every day | CUTANEOUS | Status: DC
  Administered 2022-05-22 – 2022-05-23 (×2): 1 via TOPICAL
  Filled 2022-05-20 (×2): qty 14.17

## 2022-05-20 MED ORDER — AMLODIPINE BESYLATE 5 MG PO TABS
5.0000 mg | ORAL_TABLET | Freq: Every day | ORAL | Status: DC
  Administered 2022-05-20 – 2022-05-23 (×4): 5 mg via ORAL
  Filled 2022-05-20: qty 1
  Filled 2022-05-20: qty 7
  Filled 2022-05-20 (×5): qty 1

## 2022-05-20 MED ORDER — CYANOCOBALAMIN 500 MCG PO TABS
500.0000 ug | ORAL_TABLET | Freq: Every day | ORAL | Status: DC
  Administered 2022-05-20 – 2022-05-23 (×4): 500 ug via ORAL
  Filled 2022-05-20 (×5): qty 1

## 2022-05-20 NOTE — Progress Notes (Signed)
Adult Psychoeducational Group Note  Date:  05/20/2022 Time:  8:36 PM  Group Topic/Focus:  Wrap-Up Group:   The focus of this group is to help patients review their daily goal of treatment and discuss progress on daily workbooks.  Participation Level:  Active  Participation Quality:  Appropriate  Affect:  Appropriate  Cognitive:  Appropriate  Insight: Appropriate  Engagement in Group:  Engaged  Modes of Intervention:  Discussion  Additional Comments:   Pt states that he had a good day and was able to reflect a lot more on his decisions that brought him here. Pt spoke to father and his doctor today, receiving good information from both. Pt endorsed slight feelings of anxiety .   Gerhard Perches 05/20/2022, 8:36 PM

## 2022-05-20 NOTE — Progress Notes (Signed)
Patient stated he slept well last night and denies any pain currently. Patient denies SI, HI, and A/V/H with no plan/intent. Patient's CIWA is 0 with no severe s/s of alcohol withdrawal. Patient states his depression is a 3 and anxiety a 0. Patient observed interacting with other patients in dayroom and joining in activities. Patient is med compliant and cooperative in unit.    05/20/22 0805  Psych Admission Type (Psych Patients Only)  Admission Status Involuntary  Psychosocial Assessment  Patient Complaints Depression  Eye Contact Fair  Facial Expression Flat  Affect Appropriate to circumstance  Speech Logical/coherent  Interaction Assertive  Motor Activity Other (Comment) (WDL)  Appearance/Hygiene Unremarkable  Behavior Characteristics Cooperative;Appropriate to situation  Mood Depressed;Pleasant  Thought Process  Coherency WDL  Content WDL  Delusions None reported or observed  Perception WDL  Hallucination None reported or observed  Judgment Limited  Confusion None  Danger to Self  Current suicidal ideation? Denies  Danger to Others  Danger to Others None reported or observed

## 2022-05-20 NOTE — Progress Notes (Signed)
   05/19/22 2200  Psych Admission Type (Psych Patients Only)  Admission Status Involuntary  Psychosocial Assessment  Patient Complaints Anxiety  Eye Contact Fair  Facial Expression Animated  Affect Appropriate to circumstance  Speech Logical/coherent  Interaction Assertive  Motor Activity Slow  Appearance/Hygiene Unremarkable  Behavior Characteristics Cooperative;Appropriate to situation  Mood Pleasant  Thought Process  Coherency WDL  Content WDL  Delusions None reported or observed  Perception WDL  Hallucination None reported or observed  Judgment Poor  Confusion None  Danger to Self  Current suicidal ideation? Denies  Danger to Others  Danger to Others None reported or observed

## 2022-05-20 NOTE — [facility] Group Notes (Signed)
Adult Psychoeducational Group Note  Date:  05/20/2022 Time:  9:46 AM  Group Topic/Focus:  Emotional Education:   The focus of this group is to discuss what feelings/emotions are, and how they are experienced.  Participation Level:  Active  Participation Quality:  Appropriate  Affect:  Appropriate  Cognitive:  Appropriate  Insight: Appropriate  Engagement in Group:  Engaged  Modes of Intervention:  Education  Additional Comments:  The group was on " Don't make Assumptions"  Camila Li 05/20/2022, 9:46 AM

## 2022-05-20 NOTE — Plan of Care (Signed)
  Problem: Education: Goal: Mental status will improve Outcome: Progressing Goal: Verbalization of understanding the information provided will improve Outcome: Progressing   Problem: Health Behavior/Discharge Planning: Goal: Ability to identify changes in lifestyle to reduce recurrence of condition will improve Outcome: Progressing

## 2022-05-20 NOTE — [facility] Group Notes (Signed)
Pt attended goals group 

## 2022-05-20 NOTE — Progress Notes (Signed)
[facility]  05/20/2022 12:37 PM Ethan Beasley  MRN:  [id number] Principal Problem: MDD (major depressive disorder), recurrent severe, without psychosis (HCC) Diagnosis: Principal Problem:   MDD (major depressive disorder), recurrent severe, without psychosis (HCC) Active Problems:   Severe alcohol dependence (HCC)   Anxiety state   Insomnia   History of asthma   Nicotine dependence  Reason For Admission: Ethan Beasley is a 22 yo male with a mental health history of MDD & alcohol use disorder who made cuts to his left wrist with a paring knife during an argument with his family in the context of alcohol intoxication.  He was  transported to the [facility], was [employer] [place] [facility] for treatment and stabilization of his mood.   24 hr chart review: Blood pressures noted to be consistently elevated since admission with systolic blood pressures running in the 140s and diastolic blood pressures in the 90s.  Heart rate has fluctuated between the 90s to the 120s.  No as needed medications given overnight, patient compliant with his scheduled medications.  Patient has attended unit group sessions in the past 24 hours and has been noted to be appropriate and interactive.  Patient assessment note 05/20/2022: Mood today is less depressed.  Affect is congruent.  Patient denies SI, denies HI, denies AVH, denies paranoia, and there is no evidence of other delusional thoughts. His attention to personal hygiene and grooming is fair, eye contact is good, speech is clear & coherent. Thought contents are organized and logical.   Patient reports that he is tolerating medications well.  He denies medication related side effects.  He reports that he is tolerating Ativan taper for detox of alcohol.  He reports being worried about his job.  Educated to ask his sister if she can inquire about getting a leave of absence forms  from his job so that he can completed while hospitalized.  Old dressings to left outer wrist area and left hand area near the thumb and pinky fingers taken off. Sutures to area well approximated, no drainage noted to areas, no edema/redness noted to sites. Areas covered with large Band-Aids pending application of bacitracin ointment to area by pt's RN. Orders placed.  Patient reports a good sleep quality last night, reports a good appetite, we are continuing medications as listed below.  Continues hospitalization remains necessary at this time, as patient is continuing his Ativan taper for detox from alcohol use.  He is tolerating coming off alcohol well at this time.  Zoloft 50 mg started today for management of depressive symptoms. Starting Norvasc 5 mg daily for hypertension.  Total Time spent with patient: 45 minutes  Past Psychiatric History: MDD  Past Medical History:  Past Medical History:  Diagnosis Date   Asthma    prn inhaler   History of concussion 2014   Hydrocele, left 11/2014    Past Surgical History:  Procedure Laterality Date   EXCISION OF SKIN TAG Bilateral 12/10/2003   preauricular   HYDROCELE EXCISION Left 12/23/2014   Procedure: LEFT HYDROCELE REPAIR;  Surgeon: Leonia CoronaShuaib Farooqui, MD;  Location: [facility];  Service: Pediatrics;  Laterality: Left;   TONSILLECTOMY AND ADENOIDECTOMY  12/10/2003   Family History:  Family History  Problem Relation Age of Onset   Hypertension Father    Sarcoidosis Father    Family Psychiatric  History: See H & P Social History:  Social History  Substance and Sexual Activity  Alcohol Use Yes   Alcohol/week: 42.0 standard drinks of alcohol   Types: 42 Cans of beer per week     Social History   Substance and Sexual Activity  Drug Use No    Social History   Socioeconomic History   Marital status: Single    Spouse name: Not on file   Number of children: Not on file   Years of education: Not on file   Highest  education level: Not on file  Occupational History   Not on file  Tobacco Use   Smoking status: Every Day    Packs/day: 0.50    Types: Cigarettes    Passive exposure: Never   Smokeless tobacco: Former  Building services engineer Use: Never used  Substance and Sexual Activity   Alcohol use: Yes    Alcohol/week: 42.0 standard drinks of alcohol    Types: 42 Cans of beer per week   Drug use: No   Sexual activity: Not Currently    Birth control/protection: None  Other Topics Concern   Not on file  Social History Narrative   Not on file   Social Determinants of Health   Financial Resource Strain: Not on file  Food Insecurity: No Food Insecurity (05/19/2022)   Hunger Vital Sign    Worried About Running Out of Food in the Last Year: Never true    Ran Out of Food in the Last Year: Never true  Transportation Needs: Unknown (05/19/2022)   PRAPARE - Administrator, Civil Service (Medical): Not on file    Lack of Transportation (Non-Medical): No  Physical Activity: Not on file  Stress: Not on file  Social Connections: Not on file   Sleep: Good  Appetite:  Good  Current Medications: Current Facility-Administered Medications  Medication Dose Route Frequency Provider Last Rate Last Admin   acetaminophen (TYLENOL) tablet 650 mg  650 mg Oral Q6H PRN Ajibola, Ene A, NP   650 mg at 05/20/22 0619   albuterol (VENTOLIN HFA) 108 (90 Base) MCG/ACT inhaler 2 puff  2 puff Inhalation Q6H PRN Jannelle Notaro, NP       alum & mag hydroxide-simeth (MAALOX/MYLANTA) 200-200-20 MG/5ML suspension 30 mL  30 mL Oral Q4H PRN Ajibola, Ene A, NP       amLODipine (NORVASC) tablet 5 mg  5 mg Oral Daily Tena Linebaugh, NP       cyanocobalamin (VITAMIN B12) tablet 500 mcg  500 mcg Oral Daily Wm Sahagun, NP       hydrOXYzine (ATARAX) tablet 25 mg  25 mg Oral TID PRN Ajibola, Ene A, NP       hydrOXYzine (ATARAX) tablet 25 mg  25 mg Oral Q6H PRN Roberts Bon, NP       loperamide (IMODIUM) capsule 2-4 mg   2-4 mg Oral PRN Starleen Blue, NP       loratadine (CLARITIN) tablet 10 mg  10 mg Oral Daily Starleen Blue, NP   10 mg at 05/20/22 0805   LORazepam (ATIVAN) tablet 1 mg  1 mg Oral TID Starleen Blue, NP       Followed by   Melene Muller ON 05/21/2022] LORazepam (ATIVAN) tablet 1 mg  1 mg Oral BID Starleen Blue, NP       Followed by   Melene Muller ON 05/23/2022] LORazepam (ATIVAN) tablet 1 mg  1 mg Oral Daily Sharai Overbay, NP       magnesium hydroxide (MILK OF MAGNESIA) suspension 30 mL  30 mL Oral Daily PRN Ajibola, Ene A, NP       multivitamin with minerals tablet 1 tablet  1 tablet Oral Daily Nicholes Rough, NP   1 tablet at 05/20/22 0805   nicotine (NICODERM CQ - dosed in mg/24 hours) patch 21 mg  21 mg Transdermal Daily Jenet Durio, NP       ondansetron (ZOFRAN-ODT) disintegrating tablet 4 mg  4 mg Oral Q6H PRN Nicholes Rough, NP       sertraline (ZOLOFT) tablet 50 mg  50 mg Oral Daily Nicholes Rough, NP   50 mg at 05/20/22 0805   thiamine (Vitamin B-1) tablet 100 mg  100 mg Oral Daily Nicholes Rough, NP   100 mg at 05/20/22 1478    Lab Results:  Results for orders placed or performed during the hospital encounter of 05/19/22 (from the past 48 hour(s))  Urinalysis, Complete w Microscopic Urine, Clean Catch     Status: None   Collection Time: 05/19/22 10:30 AM  Result Value Ref Range   Color, Urine YELLOW YELLOW   APPearance CLEAR CLEAR   Specific Gravity, Urine 1.027 1.005 - 1.030   pH 6.0 5.0 - 8.0   Glucose, UA NEGATIVE NEGATIVE mg/dL   Hgb urine dipstick NEGATIVE NEGATIVE   Bilirubin Urine NEGATIVE NEGATIVE   Ketones, ur NEGATIVE NEGATIVE mg/dL   Protein, ur NEGATIVE NEGATIVE mg/dL   Nitrite NEGATIVE NEGATIVE   Leukocytes,Ua NEGATIVE NEGATIVE   RBC / HPF 0-5 0 - 5 RBC/hpf   WBC, UA 0-5 0 - 5 WBC/hpf   Bacteria, UA NONE SEEN NONE SEEN   Squamous Epithelial / HPF 0-5 0 - 5 /HPF   Mucus PRESENT     Comment: Performed at [facility], [place] [address]  [place], [place] [address]  Hemoglobin A1c     Status: Abnormal   Collection Time: 05/20/22  6:32 AM  Result Value Ref Range   Hgb A1c MFr Bld 4.5 (L) 4.8 - 5.6 %    Comment: (NOTE) Pre diabetes:          5.7%-6.4%  Diabetes:              >6.4%  Glycemic control for   <7.0% adults with diabetes    Mean Plasma Glucose 82.45 mg/dL    Comment: Performed at [place] [address], [place], [place] [address]  TSH     Status: None   Collection Time: 05/20/22  6:32 AM  Result Value Ref Range   TSH 2.760 0.350 - 4.500 uIU/mL    Comment: Performed by a 3rd Generation assay with a functional sensitivity of <=0.01 uIU/mL. Performed at [facility], [place] [address] [place], [place] [address]   Lipid panel     Status: None   Collection Time: 05/20/22  6:32 AM  Result Value Ref Range   Cholesterol 194 0 - 200 mg/dL   Triglycerides 94 <150 mg/dL   HDL 102 >40 mg/dL   Total CHOL/HDL Ratio 1.9 RATIO   VLDL 19 0 - 40 mg/dL   LDL Cholesterol 73 0 - 99 mg/dL    Comment:        Total Cholesterol/HDL:CHD Risk Coronary Heart Disease Risk Table                     Men   Women  1/2 Average Risk   3.4   3.3  Average Risk       5.0  4.4  2 X Average Risk   9.6   7.1  3 X Average Risk  23.4   11.0        Use the calculated Patient Ratio above and the CHD Risk Table to determine the patient's CHD Risk.        ATP Beasley CLASSIFICATION (LDL):  <100     mg/dL   Optimal  419-622  mg/dL   Near or Above                    Optimal  130-159  mg/dL   Borderline  297-989  mg/dL   High  >211     mg/dL   Very High Performed at [facility], [address], [place], [place] [address]   Vitamin B12     Status: Abnormal   Collection Time: 05/20/22  6:32 AM  Result Value Ref Range   Vitamin B-12 138 (L) 180 - 914 pg/mL    Comment: (NOTE) This assay is not validated for testing neonatal or myeloproliferative syndrome specimens for Vitamin B12 levels. Performed  at [facility], [address] [place], [place] [address]     Blood Alcohol level:  Lab Results  Component Value Date   ETH 265 (H) 05/18/2022   ETH 212 (H) 05/28/2020    Metabolic Disorder Labs: Lab Results  Component Value Date   HGBA1C 4.5 (L) 05/20/2022   MPG 82.45 05/20/2022   MPG 94 12/30/2013   Lab Results  Component Value Date   PROLACTIN 12.3 12/30/2013   Lab Results  Component Value Date   CHOL 194 05/20/2022   TRIG 94 05/20/2022   HDL 102 05/20/2022   CHOLHDL 1.9 05/20/2022   VLDL 19 05/20/2022   LDLCALC 73 05/20/2022   LDLCALC 75 12/30/2013    Physical Findings: AIMS:  , ,  ,  ,    CIWA:  CIWA-Ar Total: 0 COWS:     Musculoskeletal: Strength & Muscle Tone: within normal limits Gait & Station: normal Patient leans: N/A  Psychiatric Specialty Exam:  Presentation  General Appearance:  Appropriate for Environment; Fairly Groomed  Eye Contact: Fair  Speech: Clear and Coherent  Speech Volume: Normal  Handedness: Right   Mood and Affect  Mood: Depressed  Affect: Congruent   Thought Process  Thought Processes: Coherent  Descriptions of Associations:Intact  Orientation:Full (Time, Place and Person)  Thought Content:Logical  History of Schizophrenia/Schizoaffective disorder:No data recorded Duration of Psychotic Symptoms:No data recorded Hallucinations:Hallucinations: None  Ideas of Reference:None  Suicidal Thoughts:Suicidal Thoughts: No  Homicidal Thoughts:Homicidal Thoughts: No   Sensorium  Memory: Immediate Good  Judgment: Fair  Insight: Fair   Art therapist  Concentration: Good  Attention Span: Good  Recall: Fair  Fund of Knowledge: Fair  Language: Fair   Psychomotor Activity  Psychomotor Activity: Psychomotor Activity: Normal   Assets  Assets: Housing   Sleep  Sleep: Sleep: Good    Physical Exam: Physical Exam Constitutional:      Appearance:  Normal appearance.  HENT:     Head: Normocephalic.     Nose: Nose normal.  Eyes:     Pupils: Pupils are equal, round, and reactive to light.  Musculoskeletal:        General: Normal range of motion.     Cervical back: Normal range of motion.  Neurological:     Mental Status: He is alert and oriented to person, place, and time.     Sensory: No sensory deficit.  Review of Systems  Constitutional: Negative.  Negative for fever.  HENT: Negative.    Eyes: Negative.   Respiratory: Negative.  Negative for cough.   Cardiovascular: Negative.  Negative for chest pain.  Gastrointestinal: Negative.   Genitourinary: Negative.   Musculoskeletal: Negative.   Skin: Negative.   Neurological: Negative.  Negative for dizziness.  Psychiatric/Behavioral:  Positive for depression and substance abuse. Negative for hallucinations, memory loss and suicidal ideas. The patient is nervous/anxious and has insomnia.    Blood pressure (!) 144/99, pulse 82, temperature 99.2 F (37.3 C), temperature source Oral, resp. rate 20, height 5\' 11"  (1.803 m), weight 77.1 kg, SpO2 98 %. Body mass index is 23.71 kg/m.   Treatment Plan Summary: Daily contact with patient to assess and evaluate symptoms and progress in treatment and Medication management   Observation Level/Precautions:  15 minute checks  Laboratory:  Labs reviewed   Psychotherapy:  Unit Group sessions  Medications:  See [facility]  Consultations:  To be determined   Discharge Concerns:  Safety, medication compliance, mood stability  Estimated LOS: 5-7 days  Other:  N/A    Labs reviewed independently on 05/20/2022: CMP WNL, CBC WNL, tox screen negative, BAL 265.  EKG with QTc 397.  Labs ordered: Hemoglobin A1c WNL, TSH WNL, lipid panel WNL, baseline UA WNL, vitamins B1 pending. Vit D pending. Vit B12 low at 138. Will supplement.   PLAN Safety and Monitoring: Voluntary admission to inpatient psychiatric unit for safety, stabilization and  treatment Daily contact with patient to assess and evaluate symptoms and progress in treatment Patient's case to be discussed in multi-disciplinary team meeting Observation Level : q15 minute checks Vital signs: q12 hours Precautions: Safety   Diagnoses Principal Problem:   MDD (major depressive disorder), recurrent severe, without psychosis ([place]) Active Problems:   Severe alcohol dependence (HCC)   Anxiety state   Insomnia   History of asthma   Nicotine dependence   Medications -Start Zoloft 50 mg today, 05/20/2021 for depressive symptoms -Start Norvasc 5 mg daily for htn -Continue Claritin 10 mg daily for seasonal allergic rhinitis. -Continue albuterol inhaler as needed for wheezing or shortness of breath -Continue nicotine patch daily for nicotine dependence -Continue hydroxyzine 25 mg every 6 hours PRNAnxiety/CIWA < or =10 -Continue Ativan detox protocol for alcohol use disorder -Continue trazodone 50 mg nightly as needed for sleep -Continue cyanocobalamin 500 mg daily for Vitamin B12 deficiency   Other PRNS -Continue Tylenol 650 mg every 6 hours PRN for mild pain -Continue Maalox 30 mg every 4 hrs PRN for indigestion -Continue Imodium 2-4 mg as needed for diarrhea -Continue Milk of Magnesia as needed every 6 hrs for constipation -Continue Zofran disintegrating tabs every 6 hrs PRN for nausea    Long Term Goal(s): Improvement in symptoms so as ready for discharge   Short Term Goals: Ability to identify changes in lifestyle to reduce recurrence of condition will improve, Ability to verbalize feelings will improve, Ability to identify and develop effective coping behaviors will improve, Ability to maintain clinical measurements within normal limits will improve, Compliance with prescribed medications will improve, and Ability to identify triggers associated with substance abuse/mental health issues will improve   Discharge Planning: Social work and case management to assist  with discharge planning and identification of hospital follow-up needs prior to discharge Estimated LOS: 5-7 days Discharge Concerns: Need to establish a safety plan; Medication compliance and effectiveness Discharge Goals: Return home with outpatient referrals for mental health follow-up including medication management/psychotherapy  I certify that inpatient services furnished can reasonably be expected to improve the patient's condition.     Starleen Blue, NP 1/20/20242:05 PM   Starleen Blue, NP 05/20/2022, 12:37 PM

## 2022-05-21 ENCOUNTER — Encounter (HOSPITAL_COMMUNITY): Payer: Self-pay | Admitting: Psychiatry

## 2022-05-21 ENCOUNTER — Encounter (HOSPITAL_COMMUNITY): Payer: Self-pay

## 2022-05-21 MED ORDER — SERTRALINE HCL 100 MG PO TABS
100.0000 mg | ORAL_TABLET | Freq: Every day | ORAL | Status: DC
  Administered 2022-05-22 – 2022-05-23 (×2): 100 mg via ORAL
  Filled 2022-05-21 (×3): qty 1

## 2022-05-21 NOTE — [facility] Group Notes (Signed)
Patient attended the AA group. 

## 2022-05-21 NOTE — [facility] Suicide Risk Assessment (Signed)
[place] INPATIENT:  Family/Significant Other Suicide Prevention Education  Suicide Prevention Education:  Education Completed; Daylyn Azbill II, father, [phone] has been identified by the patient as the family member/significant other with whom the patient will be residing, and identified as the person(s) who will aid the patient in the event of a mental health crisis (suicidal ideations/suicide attempt).  With written consent from the patient, the family member/significant other has been provided the following suicide prevention education, prior to the and/or following the discharge of the patient.  The suicide prevention education provided includes the following: Suicide risk factors Suicide prevention and interventions National Suicide Hotline telephone number [facility] assessment telephone number [facility] Emergency Assistance [place] and/or Residential Mobile Crisis Unit telephone number  Request made of family/significant other to: Remove weapons (e.g., guns, rifles, knives), all items previously/currently identified as safety concern.   Remove drugs/medications (over-the-counter, prescriptions, illicit drugs), all items previously/currently identified as a safety concern.  The family member/significant other verbalizes understanding of the suicide prevention education information provided.  The family member/significant other agrees to remove the items of safety concern listed above.  Durenda Hurt 05/21/2022, 11:33 AM

## 2022-05-21 NOTE — Progress Notes (Signed)
   05/20/22 2100  Psych Admission Type (Psych Patients Only)  Admission Status Involuntary  Psychosocial Assessment  Patient Complaints Anxiety;Isolation  Eye Contact Fair  Facial Expression Animated  Affect Appropriate to circumstance  Speech Logical/coherent  Interaction Assertive  Motor Activity Slow  Appearance/Hygiene Improved  Behavior Characteristics Cooperative;Appropriate to situation  Mood Pleasant  Thought Process  Coherency WDL  Content WDL  Delusions None reported or observed  Perception WDL  Hallucination None reported or observed  Judgment Poor  Confusion None  Danger to Self  Current suicidal ideation? Denies  Danger to Others  Danger to Others None reported or observed

## 2022-05-21 NOTE — [facility] Group Notes (Signed)
Spiritual care group on grief and loss facilitated by chaplain Elleen Coulibaly Andree Elk and Lysle Morales, counseling intern.   Group Goal: Support / Education around grief and loss  Members engage in facilitated group support and psycho-social education.  Group Description:  Following introductions and group rules, group members engaged in facilitated group dialogue and support around topic of loss, with particular support around experiences of loss in their lives. Group Identified types of loss (relationships / self / things) and identified patterns, circumstances, and changes that precipitate losses. Reflected on thoughts / feelings around loss, normalized grief responses, and recognized variety in grief experience. Group encouraged individual reflection on safe space and on the coping skills that they are already utilizing.   Group drew on Adlerian / Rogerian and narrative framework  Patient Progress:Ethan Beasley actively participated in group.  He explored loss, his emotions towards grief, and different coping skills that are life-giving for him, which include music and writing.

## 2022-05-21 NOTE — [facility] Counselor (Signed)
Adult Comprehensive Assessment  Patient ID: Ethan Beasley, male   DOB: 04-23-2001, 22 y.o.   MRN: [id number]  Information Source: Information source: Patient  Current Stressors:  Patient states their primary concerns and needs for treatment are:: During assessment, patient states his life is in a state of confusion. Admits to cutting his upper portion of his wrists while intoxicated on 2x 40oz malt  liquer. Denies memory of the day leading up to attempt. States, "My life in general is bad because I moved away" (from natural supports and enviroment) 2  months ago. Since then patient endorses anhedonia, isolation, and lack of motivation/energy. Endorses excessive alcohol use 1-3 40oz malt liquer 4-6x weekly in order to cope since being incarcerated 3 years ago. Additionally, patient is recently employed in November, acknowleges added stress due to employment. Patient reports one prior suicide attempt in childhood acknowleging underlying risk of suicide; chart review shows behavioral health admission in 37 (age 28). Patient states their goals for this hospitilization and ongoing recovery are:: Patient unsure of goal for hospitalization. Educational / Learning stressors: none reported Employment / Job issues: recently employed in November Family Relationships: none reported Museum/gallery curator / Lack of resources (include bankruptcy): none reported Housing / Lack of housing: none reported Physical health (include injuries & life threatening diseases): none reports Social relationships: reports he does not see his friends as often since moving to [place] of [place] Substance abuse: patient denies, UDS neg for all substance. endorses ongoing binge drinking Bereavement / Loss: none repoted  Living/Environment/Situation:  Living Arrangements: Other relatives Living conditions (as described by patient or guardian): States living conditions are WNL Who else lives in the home?: Pt lives with sister How  long has patient lived in current situation?: 2 months What is atmosphere in current home: Comfortable, Supportive  Family History:  Marital status: Single Are you sexually active?: No What is your sexual orientation?: Heterosexual Has your sexual activity been affected by drugs, alcohol, medication, or emotional stress?: none reported Does patient have children?: No  Childhood History:  By whom was/is the patient raised?: Both parents Description of patient's relationship with caregiver when they were a child: Describes his relationship with his parents as a child as "it was great, not bad at all." Patient's description of current relationship with people who raised him/her: Describes his current relationship with his parents as "its still good but I am not a little boy anymore" Does patient have siblings?: Yes Number of Siblings: 2 Description of patient's current relationship with siblings: States he gets along well with his siblings Did patient suffer any verbal/emotional/physical/sexual abuse as a child?: No Did patient suffer from severe childhood neglect?: No Has patient ever been sexually abused/assaulted/raped as an adolescent or adult?: No Was the patient ever a victim of a crime or a disaster?: No Witnessed domestic violence?: No Has patient been affected by domestic violence as an adult?: No  Education:  Highest grade of school patient has completed: HS diploma Currently a Ship broker?: No Learning disability?: No  Employment/Work Situation:   Employment Situation: Employed Where is Patient Currently Employed?: [place] has Patient Been Employed?: 2 months Are You Satisfied With Your Job?: Yes Do You Work More Than One Job?: No Patient's Job has Been Impacted by Current Illness: Yes Describe how Patient's Job has Been Impacted: states "it has in the past, but not affecting this job" Has Patient ever Been in the [employer]?: No  Financial Resources:    Financial resources:  Income from employment, Private insurance Does patient have a representative payee or guardian?: No  Alcohol/Substance Abuse:   Social History   Substance and Sexual Activity  Alcohol Use Yes   Alcohol/week: 35.0 standard drinks of alcohol   Types: 35 Cans of beer per week   Comment: 1-3 40oz malt liquer 4-6x weekly   Social History   Substance and Sexual Activity  Drug Use No   Tobacco Use: High Risk (05/21/2022)   Patient History    Smoking Tobacco Use: Every Day    Smokeless Tobacco Use: Former    Passive Exposure: Never   If attempted suicide, did drugs/alcohol play a role in this?: Yes (patient was intoxicated at time of attempt) Alcohol/Substance Abuse Treatment Hx: Denies past history  Social Support System:   Heritage manager System: Manufacturing engineer System: lists his family as supportive of his mental health Type of faith/religion: Chrisitan How does patient's faith help to cope with current illness?: patient endorses  Leisure/Recreation:   Do You Have Hobbies?: Yes Leisure and Hobbies: states "listen to music, read, go out and explore, work out."  Strengths/Needs:   Patient states these barriers may affect/interfere with their treatment: none reported Patient states these barriers may affect their return to the community: none reported Other important information patient would like considered in planning for their treatment: none reported  Discharge Plan:   Currently receiving community mental health services: No Does patient have access to transportation?: Yes Does patient have financial barriers related to discharge medications?: Yes (no insurance listed) Will patient be returning to same living situation after discharge?: No  Summary/Recommendations:   Summary and Recommendations (to be completed by the evaluator): 22 y/o male w/ dx of MDD recurrent severe, w/ out psychotic features from [facility] w/ no  listed insurance admitted due to self-inflicted lacerations to the forarm with suicidal intent. During assessment, patient states his life is in a state of confusion. Admits to cutting his upper portion of his wrists while intoxicated on 2x 40oz malt liquer. Denies memory of the day leading up to attempt. States, "My life in general is bad because I moved away" (from natural supports and enviroment) 2 months ago. Since then patient endorses anhedonia, isolation, and lack of motivation/energy. Endorses excessive alcohol use 1-3 40oz malt liquer 4-6x weekly in order to cope since being incarcerated 3 years ago. Additionally, patient is recently employed in November, acknowleges added stress due to employment. Patient reports one prior suicide attempt in childhood acknowleging underlying risk of suicide; chart review shows behavioral health admission in 51 (age 67). Patient unsure of goal for hospitalization.  Patient presents as calm, cooperative, and polite. Affect is depressed, congruent with mood and context. Appearance is WNL. Speech volume low, speed is slightly slowed, and content is WNL. No evidence of memory or concentration impairment. Patient oriented to person, place, time, and situation. No evidence of psychotic features present.    Patient is not currently associated with any outpatient mental health; has signed consent for CSW to make appropriate referrals for outpatient mental health tx. Therapeutic recommendations include further crisis stabilization, medication management, group therapy, and case management.   Durenda Hurt. 05/21/2022

## 2022-05-21 NOTE — Group Note (Signed)
Recreation Therapy Group Note   Group Topic:Health and Wellness  Group Date: 05/21/2022 Start Time: 0933 End Time: 0958 Facilitators: Annaliesa Blann-McCall, LRT,CTRS Location: [address] Dayroom   Goal Area(s) Addresses:  Patient will verbalize benefit of exercise during group session. Patient will verbalize an exercise that can be completed in their hospital room during admission. Patient will identify an exercise that can be completed post d/c. Patient will acknowledge benefits of exercise when used as a coping mechanism.   Group Description:  Exercise.  LRT and patients discussed the importance of exercise and how it benefits the individual.  LRT then explained to patients, they would be leading the group.  LRT explained each person would have the chance to lead the group in an exercise, stretch or dance move of their choosing.  Patients were informed to be mindful of the exercises they choose and to take in consideration their peers and any limitations they may have.  Patients were encouraged to have a good time and move!   Affect/Mood: Appropriate   Participation Level: Engaged   Participation Quality: Independent   Behavior: Appropriate   Speech/Thought Process: Focused   Insight: Good   Judgement: Good   Modes of Intervention: Music and Exercise   Patient Response to Interventions:  Engaged   Education Outcome:  Acknowledges education and In group clarification offered    Clinical Observations/Individualized Feedback: Pt was quiet but engaged in activity.  Pt was attentive and completed the exercises presented in group session.    Plan: Continue to engage patient in RT group sessions 2-3x/week.   Quita Mcgrory-McCall, LRT,CTRS  05/21/2022 12:44 PM

## 2022-05-21 NOTE — Progress Notes (Signed)
Patient appears in better mood today and stated he slept well. Patient denies SI,HI, and A/V/H with no plan or intent. Patient reported having pain 3 out of 10 on L hand. Stitches and affected area assessed. Stitched areas appears to have improved (cleaner) since yesterdays dressing change and appears with reduced swelling. Decreased erythema also noted. Patient given tylenol po prn and cut was rinse with saline, neosporin applied, and dressing changed. Patient is med compliant and continues to be cooperative on unit.     05/21/22 0811  Psych Admission Type (Psych Patients Only)  Admission Status Involuntary  Psychosocial Assessment  Patient Complaints None  Eye Contact Fair  Facial Expression Animated  Affect Appropriate to circumstance  Speech Logical/coherent  Interaction Assertive  Motor Activity Other (Comment) (WDL)  Appearance/Hygiene Unremarkable  Behavior Characteristics Cooperative;Appropriate to situation  Mood Pleasant  Thought Process  Coherency WDL  Content WDL  Delusions None reported or observed  Perception WDL  Hallucination None reported or observed  Judgment Limited  Confusion None  Danger to Self  Current suicidal ideation? Denies  Danger to Others  Danger to Others None reported or observed

## 2022-05-21 NOTE — Progress Notes (Signed)
[facility] MD Progress Note  05/21/2022 2:09 PM Ethan Beasley  MRN:  [id number] Principal Problem: MDD (major depressive disorder), recurrent severe, without psychosis (HCC) Diagnosis: Principal Problem:   MDD (major depressive disorder), recurrent severe, without psychosis (HCC) Active Problems:   Severe alcohol dependence (HCC)   Anxiety state   Insomnia   History of asthma   Nicotine dependence  Reason For Admission: Ethan Beasley is a 22 yo male with a mental health history of MDD & alcohol use disorder who made cuts to his left wrist with a paring knife during an argument with his family in the context of alcohol intoxication.  He was  transported to the [employer], was [employer] and transported by the [facility] police department to this [place] [facility]) for treatment and stabilization of his mood.   24 hr chart review: Blood pressures noted to have some improvements today as compared to yesterday. SBP in the 130s as compared to 140s yesterday. Heart rate has decreased as compared to yesterday.  Only PRN med given overnight was Hydroxyzine 25 mg last night. Pt received Tylenol earlier today morning for pain.  Patient assessment note 05/21/2022: Mood today continues to be less depressed.  Affect is continuing to be congruent.  Patient has continued to deny SI, denies HI, denies AVH, denies paranoia, and there is no evidence of other delusional thoughts. His attention to personal hygiene and grooming remains fair, eye contact is good, speech is clear & coherent. Thought contents are organized and logical.   Patient is continuing to tolerate medications as ordered, currently denies having withdrawals related to alcohol, will completed alcohol detox protocol on Wednesday, 1/24. Pt reports that he is feeling mush better as compared to at time of admission and prior to hospitalization, also reports that anxiety is less. We are increasing Zoloft to 100 mg tomorrow 1/23 for management  of depressive symptoms and anxiety and continuing other medications as listed below.  Total Time spent with patient: 45 minutes  Past Psychiatric History: MDD  Past Medical History:  Past Medical History:  Diagnosis Date   Asthma    prn inhaler   History of concussion 2014   Hydrocele, left 11/2014    Past Surgical History:  Procedure Laterality Date   EXCISION OF SKIN TAG Bilateral 12/10/2003   preauricular   HYDROCELE EXCISION Left 12/23/2014   Procedure: LEFT HYDROCELE REPAIR;  Surgeon: Leonia Corona, MD;  Location: [facility];  Service: Pediatrics;  Laterality: Left;   TONSILLECTOMY AND ADENOIDECTOMY  12/10/2003   Family History:  Family History  Problem Relation Age of Onset   Hypertension Father    Sarcoidosis Father    Family Psychiatric  History: See H & P Social History:  Social History   Substance and Sexual Activity  Alcohol Use Yes   Alcohol/week: 35.0 standard drinks of alcohol   Types: 35 Cans of beer per week   Comment: 1-3 40oz malt liquer 4-6x weekly     Social History   Substance and Sexual Activity  Drug Use No    Social History   Socioeconomic History   Marital status: Single    Spouse name: Not on file   Number of children: Not on file   Years of education: Not on file   Highest education level: Not on file  Occupational History   Not on file  Tobacco Use   Smoking status: Every Day    Packs/day: 0.50    Types: Cigarettes  Passive exposure: Never   Smokeless tobacco: Former  Building services engineer Use: Never used  Substance and Sexual Activity   Alcohol use: Yes    Alcohol/week: 35.0 standard drinks of alcohol    Types: 35 Cans of beer per week    Comment: 1-3 40oz malt liquer 4-6x weekly   Drug use: No   Sexual activity: Not Currently    Birth control/protection: None  Other Topics Concern   Not on file  Social History Narrative   Not on file   Social Determinants of Health   Financial Resource Strain: Not  on file  Food Insecurity: No Food Insecurity (05/19/2022)   Hunger Vital Sign    Worried About Running Out of Food in the Last Year: Never true    Ran Out of Food in the Last Year: Never true  Transportation Needs: Unknown (05/19/2022)   PRAPARE - Administrator, Civil Service (Medical): Not on file    Lack of Transportation (Non-Medical): No  Physical Activity: Not on file  Stress: Not on file  Social Connections: Not on file   Sleep: Good  Appetite:  Good  Current Medications: Current Facility-Administered Medications  Medication Dose Route Frequency Provider Last Rate Last Admin   acetaminophen (TYLENOL) tablet 650 mg  650 mg Oral Q6H PRN Ajibola, Ene A, NP   650 mg at 05/21/22 0814   albuterol (VENTOLIN HFA) 108 (90 Base) MCG/ACT inhaler 2 puff  2 puff Inhalation Q6H PRN Emelynn Rance, NP       alum & mag hydroxide-simeth (MAALOX/MYLANTA) 200-200-20 MG/5ML suspension 30 mL  30 mL Oral Q4H PRN Ajibola, Ene A, NP       amLODipine (NORVASC) tablet 5 mg  5 mg Oral Daily Starleen Blue, NP   5 mg at 05/21/22 2330   cyanocobalamin (VITAMIN B12) tablet 500 mcg  500 mcg Oral Daily Starleen Blue, NP   500 mcg at 05/21/22 0762   hydrOXYzine (ATARAX) tablet 25 mg  25 mg Oral TID PRN Ajibola, Ene A, NP   25 mg at 05/20/22 2042   hydrOXYzine (ATARAX) tablet 25 mg  25 mg Oral Q6H PRN Starleen Blue, NP       loperamide (IMODIUM) capsule 2-4 mg  2-4 mg Oral PRN Starleen Blue, NP       loratadine (CLARITIN) tablet 10 mg  10 mg Oral Daily Starleen Blue, NP   10 mg at 05/21/22 2633   LORazepam (ATIVAN) tablet 1 mg  1 mg Oral BID Starleen Blue, NP       Followed by   Melene Muller ON 05/23/2022] LORazepam (ATIVAN) tablet 1 mg  1 mg Oral Daily Sherine Cortese, NP       magnesium hydroxide (MILK OF MAGNESIA) suspension 30 mL  30 mL Oral Daily PRN Ajibola, Ene A, NP       multivitamin with minerals tablet 1 tablet  1 tablet Oral Daily Starleen Blue, NP   1 tablet at 05/21/22 3545    neomycin-bacitracin-polymyxin (NEOSPORIN) ointment   Topical Daily Starleen Blue, NP   Given at 05/21/22 6256   nicotine (NICODERM CQ - dosed in mg/24 hours) patch 21 mg  21 mg Transdermal Daily Zae Kirtz, NP       ondansetron (ZOFRAN-ODT) disintegrating tablet 4 mg  4 mg Oral Q6H PRN Starleen Blue, NP       [START ON 05/22/2022] sertraline (ZOLOFT) tablet 100 mg  100 mg Oral Daily Starleen Blue, NP  thiamine (Vitamin B-1) tablet 100 mg  100 mg Oral Daily Nicholes Rough, NP   100 mg at 05/21/22 4166    Lab Results:  Results for orders placed or performed during the hospital encounter of 05/19/22 (from the past 48 hour(s))  Hemoglobin A1c     Status: Abnormal   Collection Time: 05/20/22  6:32 AM  Result Value Ref Range   Hgb A1c MFr Bld 4.5 (L) 4.8 - 5.6 %    Comment: (NOTE) Pre diabetes:          5.7%-6.4%  Diabetes:              >6.4%  Glycemic control for   <7.0% adults with diabetes    Mean Plasma Glucose 82.45 mg/dL    Comment: Performed at [facility], [place] [address], [place], [place] [address]  TSH     Status: None   Collection Time: 05/20/22  6:32 AM  Result Value Ref Range   TSH 2.760 0.350 - 4.500 uIU/mL    Comment: Performed by a 3rd Generation assay with a functional sensitivity of <=0.01 uIU/mL. Performed at [facility], [place] [address] [place], [place] [address]   Lipid panel     Status: None   Collection Time: 05/20/22  6:32 AM  Result Value Ref Range   Cholesterol 194 0 - 200 mg/dL   Triglycerides 94 <150 mg/dL   HDL 102 >40 mg/dL   Total CHOL/HDL Ratio 1.9 RATIO   VLDL 19 0 - 40 mg/dL   LDL Cholesterol 73 0 - 99 mg/dL    Comment:        Total Cholesterol/HDL:CHD Risk Coronary Heart Disease Risk Table                     Men   Women  1/2 Average Risk   3.4   3.3  Average Risk       5.0   4.4  2 X Average Risk   9.6   7.1  3 X Average Risk  23.4   11.0        Use the calculated Patient Ratio above and the CHD Risk  Table to determine the patient's CHD Risk.        ATP Beasley CLASSIFICATION (LDL):  <100     mg/dL   Optimal  100-129  mg/dL   Near or Above                    Optimal  130-159  mg/dL   Borderline  160-189  mg/dL   High  >190     mg/dL   Very High Performed at [facility] [address], [place], [place] [address]   Vitamin B12     Status: Abnormal   Collection Time: 05/20/22  6:32 AM  Result Value Ref Range   Vitamin B-12 138 (L) 180 - 914 pg/mL    Comment: (NOTE) This assay is not validated for testing neonatal or myeloproliferative syndrome specimens for Vitamin B12 levels. Performed at [facility], [place] [address] [place], [place] [address]     Blood Alcohol level:  Lab Results  Component Value Date   ETH 265 (H) 05/18/2022   ETH 212 (H) 25/42/7062    Metabolic Disorder Labs: Lab Results  Component Value Date   HGBA1C 4.5 (L) 05/20/2022   MPG 82.45 05/20/2022   MPG 94 12/30/2013   Lab Results  Component Value  Date   PROLACTIN 12.3 12/30/2013   Lab Results  Component Value Date   CHOL 194 05/20/2022   TRIG 94 05/20/2022   HDL 102 05/20/2022   CHOLHDL 1.9 05/20/2022   VLDL 19 05/20/2022   LDLCALC 73 05/20/2022   LDLCALC 75 12/30/2013    Physical Findings: AIMS:  , ,  ,  ,    CIWA:  CIWA-Ar Total: 1 COWS:     Musculoskeletal: Strength & Muscle Tone: within normal limits Gait & Station: normal Patient leans: N/A  Psychiatric Specialty Exam:  Presentation  General Appearance:  Appropriate for Environment; Fairly Groomed  Eye Contact: Fair  Speech: Clear and Coherent  Speech Volume: Normal  Handedness: Right   Mood and Affect  Mood: Depressed  Affect: Congruent   Thought Process  Thought Processes: Coherent  Descriptions of Associations:Intact  Orientation:Full (Time, Place and Person)  Thought Content:Logical  History of Schizophrenia/Schizoaffective disorder:No data  recorded Duration of Psychotic Symptoms:No data recorded Hallucinations:Hallucinations: None  Ideas of Reference:None  Suicidal Thoughts:Suicidal Thoughts: No   Homicidal Thoughts:Homicidal Thoughts: No    Sensorium  Memory: Immediate Good  Judgment: Good  Insight: Good   Executive Functions  Concentration: Good  Attention Span: Good  Recall: Good  Fund of Knowledge: Good  Language: Good   Psychomotor Activity  Psychomotor Activity: Psychomotor Activity: Normal   Assets  Assets: Communication Skills   Sleep  Sleep: Sleep: Fair    Physical Exam: Physical Exam Constitutional:      Appearance: Normal appearance.  HENT:     Head: Normocephalic.     Nose: Nose normal.  Eyes:     Pupils: Pupils are equal, round, and reactive to light.  Musculoskeletal:        General: Normal range of motion.     Cervical back: Normal range of motion.  Neurological:     Mental Status: He is alert and oriented to person, place, and time.     Sensory: No sensory deficit.    Review of Systems  Constitutional: Negative.  Negative for fever.  HENT: Negative.    Eyes: Negative.   Respiratory: Negative.  Negative for cough.   Cardiovascular: Negative.  Negative for chest pain.  Gastrointestinal: Negative.   Genitourinary: Negative.   Musculoskeletal: Negative.   Skin: Negative.   Neurological: Negative.  Negative for dizziness.  Psychiatric/Behavioral:  Positive for depression and substance abuse. Negative for hallucinations, memory loss and suicidal ideas. The patient is nervous/anxious and has insomnia.    Blood pressure (!) 136/96, pulse (!) 103, temperature 98.1 F (36.7 C), temperature source Oral, resp. rate 16, height 5\' 11"  (1.803 m), weight 77.1 kg, SpO2 97 %. Body mass index is 23.71 kg/m.   Treatment Plan Summary: Daily contact with patient to assess and evaluate symptoms and progress in treatment and Medication management   Observation  Level/Precautions:  15 minute checks  Laboratory:  Labs reviewed   Psychotherapy:  Unit Group sessions  Medications:  See [facility]  Consultations:  To be determined   Discharge Concerns:  Safety, medication compliance, mood stability  Estimated LOS: 5-7 days  Other:  N/A    Labs reviewed independently on 05/20/2022: CMP WNL, CBC WNL, tox screen negative, BAL 265.  EKG with QTc 397.  Labs ordered: Hemoglobin A1c WNL, TSH WNL, lipid panel WNL, baseline UA WNL, vitamins B1 pending. Vit D pending. Vit B12 low at 138. Will supplement.   PLAN Safety and Monitoring: Voluntary admission to inpatient psychiatric unit for safety, stabilization and  treatment Daily contact with patient to assess and evaluate symptoms and progress in treatment Patient's case to be discussed in multi-disciplinary team meeting Observation Level : q15 minute checks Vital signs: q12 hours Precautions: Safety   Diagnoses Principal Problem:   MDD (major depressive disorder), recurrent severe, without psychosis (HCC) Active Problems:   Severe alcohol dependence (HCC)   Anxiety state   Insomnia   History of asthma   Nicotine dependence   Medications -Increase Zoloft to 100 mg on 05/23/2021 for depressive symptoms -Continue Norvasc 5 mg daily for htn -Continue Claritin 10 mg daily for seasonal allergic rhinitis. -Continue albuterol inhaler as needed for wheezing or shortness of breath -Continue nicotine patch daily for nicotine dependence -Continue hydroxyzine 25 mg every 6 hours PRNAnxiety/CIWA < or =10 -Continue Ativan detox protocol for alcohol use disorder -Continue trazodone 50 mg nightly as needed for sleep -Continue cyanocobalamin 500 mg daily for Vitamin B12 deficiency   Other PRNS -Continue Tylenol 650 mg every 6 hours PRN for mild pain -Continue Maalox 30 mg every 4 hrs PRN for indigestion -Continue Imodium 2-4 mg as needed for diarrhea -Continue Milk of Magnesia as needed every 6 hrs for  constipation -Continue Zofran disintegrating tabs every 6 hrs PRN for nausea    Long Term Goal(s): Improvement in symptoms so as ready for discharge   Short Term Goals: Ability to identify changes in lifestyle to reduce recurrence of condition will improve, Ability to verbalize feelings will improve, Ability to identify and develop effective coping behaviors will improve, Ability to maintain clinical measurements within normal limits will improve, Compliance with prescribed medications will improve, and Ability to identify triggers associated with substance abuse/mental health issues will improve   Discharge Planning: Social work and case management to assist with discharge planning and identification of hospital follow-up needs prior to discharge Estimated LOS: 5-7 days Discharge Concerns: Need to establish a safety plan; Medication compliance and effectiveness Discharge Goals: Return home with outpatient referrals for mental health follow-up including medication management/psychotherapy   I certify that inpatient services furnished can reasonably be expected to improve the patient's condition.     Starleen Blue, NP 1/20/20242:05 PM   Starleen Blue, NP 05/21/2022, 2:09 PMPatient ID: Smitty Pluck Beasley, male   DOB: [birth year], 22 y.o.   MRN: [id number]

## 2022-05-21 NOTE — BH IP Treatment Plan (Signed)
Interdisciplinary Treatment and Diagnostic Plan Update  05/21/2022 Time of Session: 9:55am Ethan Beasley MRN: [id number]  Principal Diagnosis: MDD (major depressive disorder), recurrent severe, without psychosis ([place])  Secondary Diagnoses: Principal Problem:   MDD (major depressive disorder), recurrent severe, without psychosis ([place]) Active Problems:   Severe alcohol dependence ([place])   Anxiety state   Insomnia   History of asthma   Nicotine dependence   Current Medications:  Current Facility-Administered Medications  Medication Dose Route Frequency Provider Last Rate Last Admin   acetaminophen (TYLENOL) tablet 650 mg  650 mg Oral Q6H PRN Ajibola, Ene A, NP   650 mg at 05/21/22 0814   albuterol (VENTOLIN HFA) 108 (90 Base) MCG/ACT inhaler 2 puff  2 puff Inhalation Q6H PRN Nkwenti, Doris, NP       alum & mag hydroxide-simeth (MAALOX/MYLANTA) 200-200-20 MG/5ML suspension 30 mL  30 mL Oral Q4H PRN Ajibola, Ene A, NP       amLODipine (NORVASC) tablet 5 mg  5 mg Oral Daily Nicholes Rough, NP   5 mg at 05/21/22 5366   cyanocobalamin (VITAMIN B12) tablet 500 mcg  500 mcg Oral Daily Nicholes Rough, NP   500 mcg at 05/21/22 4403   hydrOXYzine (ATARAX) tablet 25 mg  25 mg Oral TID PRN Ajibola, Ene A, NP   25 mg at 05/20/22 2042   hydrOXYzine (ATARAX) tablet 25 mg  25 mg Oral Q6H PRN Nicholes Rough, NP       loperamide (IMODIUM) capsule 2-4 mg  2-4 mg Oral PRN Nicholes Rough, NP       loratadine (CLARITIN) tablet 10 mg  10 mg Oral Daily Nicholes Rough, NP   10 mg at 05/21/22 4742   LORazepam (ATIVAN) tablet 1 mg  1 mg Oral BID Nicholes Rough, NP       Followed by   Derrill Memo ON 05/23/2022] LORazepam (ATIVAN) tablet 1 mg  1 mg Oral Daily Nkwenti, Doris, NP       magnesium hydroxide (MILK OF MAGNESIA) suspension 30 mL  30 mL Oral Daily PRN Ajibola, Ene A, NP       multivitamin with minerals tablet 1 tablet  1 tablet Oral Daily Nicholes Rough, NP   1 tablet at 05/21/22 5956    neomycin-bacitracin-polymyxin (NEOSPORIN) ointment   Topical Daily Nicholes Rough, NP   Given at 05/21/22 3875   nicotine (NICODERM CQ - dosed in mg/24 hours) patch 21 mg  21 mg Transdermal Daily Nkwenti, Doris, NP       ondansetron (ZOFRAN-ODT) disintegrating tablet 4 mg  4 mg Oral Q6H PRN Nicholes Rough, NP       [START ON 05/22/2022] sertraline (ZOLOFT) tablet 100 mg  100 mg Oral Daily Nkwenti, Doris, NP       thiamine (Vitamin B-1) tablet 100 mg  100 mg Oral Daily Nkwenti, Doris, NP   100 mg at 05/21/22 6433   PTA Medications: Medications Prior to Admission  Medication Sig Dispense Refill Last Dose   albuterol (PROAIR HFA) 108 (90 Base) MCG/ACT inhaler Inhale two puffs every four to six hours as needed for cough or wheeze. (Patient not taking: Reported on 05/18/2022) 1 Inhaler 0    budesonide-formoterol (SYMBICORT) 160-4.5 MCG/ACT inhaler Inhale two puffs twice daily as directed to prevent cough or wheeze. Rinse, gargle, and spit after use. (Patient not taking: Reported on 05/18/2022) 10.2 g 0    cetirizine (ZYRTEC) 10 MG tablet Take 1 tablet (10 mg total) by mouth daily. (Patient not taking: Reported on 05/18/2022)  30 tablet 0    fluticasone (FLONASE) 50 MCG/ACT nasal spray Use one spray in each nostril twice daily to prevent runny nose and congestion. (Patient not taking: Reported on 05/18/2022) 16 g 5    montelukast (SINGULAIR) 10 MG tablet Take one tablet once daily as directed (Patient not taking: Reported on 05/18/2022) 30 tablet 5     Patient Stressors: Substance abuse    Patient Strengths: Ability for insight  Average or above average intelligence  Physical Health  Supportive family/friends   Treatment Modalities: Medication Management, Group therapy, Case management,  1 to 1 session with clinician, Psychoeducation, Recreational therapy.   Physician Treatment Plan for Primary Diagnosis: MDD (major depressive disorder), recurrent severe, without psychosis ([place]) Long Term Goal(s):      Short Term Goals:    Medication Management: Evaluate patient's response, side effects, and tolerance of medication regimen.  Therapeutic Interventions: 1 to 1 sessions, Unit Group sessions and Medication administration.  Evaluation of Outcomes: Progressing  Physician Treatment Plan for Secondary Diagnosis: Principal Problem:   MDD (major depressive disorder), recurrent severe, without psychosis ([place]) Active Problems:   Severe alcohol dependence ([place])   Anxiety state   Insomnia   History of asthma   Nicotine dependence  Long Term Goal(s):     Short Term Goals:       Medication Management: Evaluate patient's response, side effects, and tolerance of medication regimen.  Therapeutic Interventions: 1 to 1 sessions, Unit Group sessions and Medication administration.  Evaluation of Outcomes: Progressing   RN Treatment Plan for Primary Diagnosis: MDD (major depressive disorder), recurrent severe, without psychosis ([place]) Long Term Goal(s): Knowledge of disease and therapeutic regimen to maintain health will improve  Short Term Goals: Ability to remain free from injury will improve, Ability to verbalize frustration and anger appropriately will improve, Ability to demonstrate self-control, Ability to participate in decision making will improve, Ability to verbalize feelings will improve, Ability to disclose and discuss suicidal ideas, Ability to identify and develop effective coping behaviors will improve, and Compliance with prescribed medications will improve  Medication Management: RN will administer medications as ordered by provider, will assess and evaluate patient's response and provide education to patient for prescribed medication. RN will report any adverse and/or side effects to prescribing provider.  Therapeutic Interventions: 1 on 1 counseling sessions, Psychoeducation, Medication administration, Evaluate responses to treatment, Monitor vital signs and CBGs as ordered,  Perform/monitor CIWA, COWS, AIMS and Fall Risk screenings as ordered, Perform wound care treatments as ordered.  Evaluation of Outcomes: Progressing   LCSW Treatment Plan for Primary Diagnosis: MDD (major depressive disorder), recurrent severe, without psychosis ([place]) Long Term Goal(s): Safe transition to appropriate next level of care at discharge, Engage patient in therapeutic group addressing interpersonal concerns.  Short Term Goals: Engage patient in aftercare planning with referrals and resources, Increase social support, Increase ability to appropriately verbalize feelings, Increase emotional regulation, Facilitate acceptance of mental health diagnosis and concerns, Facilitate patient progression through stages of change regarding substance use diagnoses and concerns, Identify triggers associated with mental health/substance abuse issues, and Increase skills for wellness and recovery  Therapeutic Interventions: Assess for all discharge needs, 1 to 1 time with Social worker, Explore available resources and support systems, Assess for adequacy in community support network, Educate family and significant other(s) on suicide prevention, Complete Psychosocial Assessment, Interpersonal group therapy.  Evaluation of Outcomes: Progressing   Progress in Treatment: Attending groups: Yes. Participating in groups: Yes. Taking medication as prescribed: Yes. Toleration medication: Yes. Family/Significant  other contact made: No, will contact:  none reported  Patient understands diagnosis: Yes. Discussing patient identified problems/goals with staff: Yes. Medical problems stabilized or resolved: Yes. Denies suicidal/homicidal ideation: Yes. Issues/concerns per patient self-inventory: No.   New problem(s) identified: No, Describe:  none reported   New Short Term/Long Term Goal(s): detox, medication management for mood stabilization; elimination of SI thoughts; development of comprehensive mental  wellness/sobriety plan    Patient Goals:  Pt states, I want to go home and get off of alcohol completely.   Discharge Plan or Barriers: Patient recently admitted. CSW will continue to follow and assess for appropriate referrals and possible discharge planning.    Reason for Continuation of Hospitalization: Anxiety Depression Medication stabilization Withdrawal symptoms  Estimated Length of Stay: 3-7 days  Last 3 [place] Suicide Severity Risk Score: Flowsheet Row [facility] (Current[facility]17/2022 in [facility] ED from [facility]/28/2020 in [facility] Department at [facility]  C-SSRS RISK CATEGORY No Risk Moderate Risk No Risk       Last PHQ 2/9 Scores:     No data to display          Scribe for Treatment Team: Beatris Si, LCSW 05/21/2022 2:18 PM

## 2022-05-21 NOTE — Plan of Care (Signed)
  Problem: Education: Goal: Mental status will improve Outcome: Progressing Goal: Verbalization of understanding the information provided will improve Outcome: Progressing   Problem: Education: Goal: Understanding of discharge needs will improve Outcome: Progressing

## 2022-05-22 LAB — MISC LABCORP TEST (SEND OUT): Labcorp test code: 81950

## 2022-05-22 LAB — VITAMIN B1: Vitamin B1 (Thiamine): 151.8 nmol/L (ref 66.5–200.0)

## 2022-05-22 NOTE — Progress Notes (Signed)
   05/21/22 2300  Psych Admission Type (Psych Patients Only)  Admission Status Involuntary  Psychosocial Assessment  Patient Complaints None  Eye Contact Fair  Facial Expression Animated  Affect Appropriate to circumstance  Speech Logical/coherent  Interaction Assertive  Motor Activity Slow  Appearance/Hygiene Unremarkable  Behavior Characteristics Cooperative;Appropriate to situation  Mood Pleasant  Thought Process  Coherency WDL  Content WDL  Delusions None reported or observed  Perception WDL  Hallucination None reported or observed  Judgment Limited  Confusion None  Danger to Self  Current suicidal ideation? Denies  Danger to Others  Danger to Others None reported or observed

## 2022-05-22 NOTE — Group Note (Signed)
Occupational Therapy Group Note   Group Topic:Goal Setting  Group Date: 05/21/2022 Start Time: 1430 End Time: 1510 Facilitators: Brantley Stage, OT   Group Description: Group encouraged engagement and participation through discussion focused on goal setting. Group members were introduced to goal-setting using the SMART Goal framework, identifying goals as Specific, Measureable, Acheivable, Relevant, and Time-Bound. Group members took time from group to create their own personal goal reflecting the SMART goal template and shared for review by peers and OT.    Therapeutic Goal(s):  Identify at least one goal that fits the SMART framework    Participation Level: Active   Participation Quality: Independent   Behavior: Appropriate   Speech/Thought Process: Coherent and Directed   Affect/Mood: Appropriate   Insight: Fair   Judgement: Fair   Individualization: pt was active in their participation of group discussion/activity. New skills identified  Modes of Intervention: Education  Patient Response to Interventions:  Attentive   Plan: Continue to engage patient in OT groups 1x/week.  05/22/2022  Brantley Stage, OT Cornell Barman, OT
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
## 2022-05-22 NOTE — Progress Notes (Signed)
Adult Psychoeducational Group Note  Date:  05/22/2022 Time:  8:46 PM  Group Topic/Focus:  Wrap-Up Group:   The focus of this group is to help patients review their daily goal of treatment and discuss progress on daily workbooks.  Participation Level:  Active  Participation Quality:  Appropriate  Affect:  Appropriate  Cognitive:  Appropriate  Insight: Appropriate  Engagement in Group:  Engaged  Modes of Intervention:  Discussion  Additional Comments:   Pt states he had a good day and was able to socialize well with his peers. Pt states he is leaving tomorrow and is excited because he will get to apply his learning to his day to day life. Pt expressed gratitude for his time here. Pt denies everything  Gerhard Perches 05/22/2022, 8:46 PM

## 2022-05-22 NOTE — Group Note (Signed)
[facility] LCSW Group Therapy Note   Group Date: 05/22/2022 Start Time: 1100 End Time: 1200   Type of Therapy/Topic:  Group Therapy:  Balance in Life  Participation Level:  Did Not Attend   Description of Group:    This group will address the concept of balance and how it feels and looks when one is unbalanced. Patients will be encouraged to process areas in their lives that are out of balance, and identify reasons for remaining unbalanced. Facilitators will guide patients utilizing problem- solving interventions to address and correct the stressor making their life unbalanced. Understanding and applying boundaries will be explored and addressed for obtaining  and maintaining a balanced life. Patients will be encouraged to explore ways to assertively make their unbalanced needs known to significant others in their lives, using other group members and facilitator for support and feedback.  Therapeutic Goals: Patient will identify two or more emotions or situations they have that consume much of in their lives. Patient will identify signs/triggers that life has become out of balance:  Patient will identify two ways to set boundaries in order to achieve balance in their lives:  Patient will demonstrate ability to communicate their needs through discussion and/or role plays      Therapeutic Modalities:   Cognitive Behavioral Therapy Solution-Focused Therapy Assertiveness Training   Shaneka Efaw S Alexei Ey, LCSW 

## 2022-05-22 NOTE — Group Note (Signed)
Recreation Therapy Group Note   Group Topic:Animal Assisted Therapy   Group Date: 05/22/2022 Start Time: 1430 End Time: 1508 Facilitators: Macey Wurtz-McCall, LRT,CTRS Location: [address] Dayroom   Animal-Assisted Activity (AAA) Program Checklist/Progress Notes Patient Eligibility Criteria Checklist & Daily Group note for Rec Tx Intervention  AAA/T Program Assumption of Risk Form signed by Patient/ or Parent Legal Guardian Yes  Patient understands his/her participation is voluntary Yes   Affect/Mood: N/A   Participation Level: Did not attend    Clinical Observations/Individualized Feedback:     Plan: Continue to engage patient in RT group sessions 2-3x/week.   Cylah Fannin-McCall, LRT,CTRS 05/22/2022 3:52 PM

## 2022-05-22 NOTE — Progress Notes (Addendum)
Pt denied SI/HI//AVH this morning. Pt requested that RN change his bandage on his wrist laceration. RN completed bandage change. No drainage or signs of infection noted from laceration. Pt has been pleasant, calm, and cooperative throughout the shift. Pt given scheduled medications as prescribed. Q15 min checks verified for safety. Patient verbally contracts for safety. Patient compliant with medications and treatment plan. Patient is interacting well on the unit. Pt is safe on the unit.   05/22/22 0945  Psych Admission Type (Psych Patients Only)  Admission Status Involuntary  Psychosocial Assessment  Patient Complaints None  Eye Contact Fair  Facial Expression Sad  Affect Appropriate to circumstance;Anxious  Speech Logical/coherent;Soft  Interaction Assertive  Motor Activity Other (Comment) (WDL)  Appearance/Hygiene Unremarkable  Behavior Characteristics Cooperative;Appropriate to situation  Mood Sad;Anxious  Thought Process  Coherency WDL  Content WDL  Delusions None reported or observed  Perception WDL  Hallucination None reported or observed  Judgment Limited  Confusion None  Danger to Self  Current suicidal ideation? Denies  Danger to Others  Danger to Others None reported or observed

## 2022-05-22 NOTE — Group Note (Signed)
Date:  05/22/2022 Time:  9:45 AM  Group Topic/Focus:  Orientation:   The focus of this group is to educate the patient on the purpose and policies of crisis stabilization and provide a format to answer questions about their admission.  The group details unit policies and expectations of patients while admitted.    Participation Level:  Active  Participation Quality:  Appropriate  Affect:  Appropriate  Cognitive:  Appropriate  Insight: Appropriate  Engagement in Group:  Engaged  Modes of Intervention:  Discussion  Additional Comments:     Jerrye Beavers 05/22/2022, 9:45 AM

## 2022-05-23 DIAGNOSIS — F332 Major depressive disorder, recurrent severe without psychotic features: Principal | ICD-10-CM

## 2022-05-23 MED ORDER — SERTRALINE HCL 100 MG PO TABS: 100.0000 mg | ORAL_TABLET | Freq: Every day | ORAL | 0 refills | Status: AC

## 2022-05-23 MED ORDER — CYANOCOBALAMIN 500 MCG PO TABS: 500.0000 ug | ORAL_TABLET | Freq: Every day | ORAL | 0 refills | Status: AC

## 2022-05-23 MED ORDER — NICOTINE 21 MG/24HR TD PT24: 21.0000 mg | MEDICATED_PATCH | Freq: Every day | TRANSDERMAL | 0 refills | Status: AC

## 2022-05-23 MED ORDER — AMLODIPINE BESYLATE 5 MG PO TABS: 5.0000 mg | ORAL_TABLET | Freq: Every day | ORAL | 0 refills | Status: AC

## 2022-05-23 NOTE — Discharge Summary (Signed)
Physician Discharge Summary Note  Patient:  Ethan Beasley is an 22 y.o., male MRN:  [id number] DOB:  02/02/2001 Patient phone:  [phone] (home)  Patient address:   [address] [place] [address],  Total Time spent with patient: 30 minutes  Date of Admission:  05/19/2022 Date of Discharge: 01/24  Reason for Admission: Ethan Beasley is a 22 yo male with a mental health history of MDD & alcohol use disorder who made cuts to his left wrist with a paring knife during an argument with his family in the context of alcohol intoxication.  He was  transported to the [employer], was [employer] and transported by the [facility] police department to this [facility]) for treatment and stabilization of his mood.     Principal Problem: MDD (major depressive disorder), recurrent severe, without psychosis (HCC) Discharge Diagnoses: Principal Problem:   MDD (major depressive disorder), recurrent severe, without psychosis (HCC) Active Problems:   Severe alcohol dependence (HCC)   Anxiety state   Insomnia   History of asthma   Nicotine dependence  Past Psychiatric History: Please see H & P  Past Medical History:  Past Medical History:  Diagnosis Date   Asthma    prn inhaler   History of concussion 2014   Hydrocele, left 11/2014    Past Surgical History:  Procedure Laterality Date   EXCISION OF SKIN TAG Bilateral 12/10/2003   preauricular   HYDROCELE EXCISION Left 12/23/2014   Procedure: LEFT HYDROCELE REPAIR;  Surgeon: Leonia Corona, MD;  Location: [facility];  Service: Pediatrics;  Laterality: Left;   TONSILLECTOMY AND ADENOIDECTOMY  12/10/2003   Family History:  Family History  Problem Relation Age of Onset   Hypertension Father    Sarcoidosis Father    Family Psychiatric  History: See H & P Social History:  Social History   Substance and Sexual Activity  Alcohol Use Yes   Alcohol/week: 35.0 standard drinks of alcohol   Types: 35  Cans of beer per week   Comment: 1-3 40oz malt liquer 4-6x weekly     Social History   Substance and Sexual Activity  Drug Use No    Social History   Socioeconomic History   Marital status: Single    Spouse name: Not on file   Number of children: Not on file   Years of education: Not on file   Highest education level: Not on file  Occupational History   Not on file  Tobacco Use   Smoking status: Every Day    Packs/day: 0.50    Types: Cigarettes    Passive exposure: Never   Smokeless tobacco: Former  Building services engineer Use: Never used  Substance and Sexual Activity   Alcohol use: Yes    Alcohol/week: 35.0 standard drinks of alcohol    Types: 35 Cans of beer per week    Comment: 1-3 40oz malt liquer 4-6x weekly   Drug use: No   Sexual activity: Not Currently    Birth control/protection: None  Other Topics Concern   Not on file  Social History Narrative   Not on file   Social Determinants of Health   Financial Resource Strain: Not on file  Food Insecurity: No Food Insecurity (05/19/2022)   Hunger Vital Sign    Worried About Running Out of Food in the Last Year: Never true    Ran Out of Food in the Last Year: Never true  Transportation Needs: Unknown (05/19/2022)  PRAPARE - Hydrologist (Medical): Not on file    Lack of Transportation (Non-Medical): No  Physical Activity: Not on file  Stress: Not on file  Social Connections: Not on file                                              HOSPITAL COURSE During the patient's hospitalization, patient had extensive initial psychiatric evaluation, and follow-up psychiatric evaluations every day. Psychiatric diagnoses provided upon initial assessment are as listed above. Patient's psychiatric medications were adjusted on admission as follows: -Start Zoloft 50 mg on 05/20/2021 for depressive symptoms -Start Claritin 10 mg daily for seasonal allergic rhinitis. -Start albuterol inhaler as needed for  wheezing or shortness of breath -Start nicotine patch daily for nicotine dependence -Start hydroxyzine 25 mg every 6 hours PRNAnxiety/CIWA < or =10 -Start Ativan detox protocol for alcohol use disorder -Continue trazodone 50 mg nightly as needed for sleep   During the hospitalization, other adjustments were made to the patient's psychiatric medication regimen. Pt has successfully completed the Ativan detox protocol for alcohol abuse and currently does not have any alcohol related withdrawal symptoms. Medications at discharge are as follows:   -Continue Zoloft 100 mg for depressive symptoms -Continue Norvasc 5 mg daily for htn -Was taking Claritin 10 mg daily for seasonal allergic rhinitis during this hospitalization, but may resume taking Zyrtec which he states is more helpful once discharged. -Continue albuterol inhaler as needed for wheezing or shortness of breath -Continue nicotine patch daily for nicotine dependence -Continue Vitamin B12 for Vitamin B12 deficiency   Pt has not required Trazodone during this hospitalization, and is not being discharged on this medication. Not also being discharged with Hydroxyzine as he has only used it once.   Patient's care was discussed during the interdisciplinary team meeting every day during the hospitalization. The patient denies having side effects to prescribed psychiatric medication. Gradually, patient started adjusting to milieu. The patient was evaluated each day by a clinical provider to ascertain response to treatment. Improvement was noted by the patient's report of decreasing symptoms, improved sleep and appetite, affect, medication tolerance, behavior, and participation in unit programming.  Patient was asked each day to complete a self inventory noting mood, mental status, pain, new symptoms, anxiety and concerns.     Symptoms were reported as significantly decreased or resolved completely by discharge.  On day of discharge, the patient  reports that their mood is stable. The patient denied having suicidal thoughts for more than 48 hours prior to discharge.  Patient denies having homicidal thoughts.  Patient denies having auditory hallucinations.  Patient denies any visual hallucinations or other symptoms of psychosis. The patient was motivated to continue taking medication with a goal of continued improvement in mental health.    The patient reports their target psychiatric symptoms of depression, anxiety, insomnia and alcohol withdrawal related symptoms responded well to the psychiatric medications, and the patient reports overall benefit from this psychiatric hospitalization. Supportive psychotherapy was provided to the patient. The patient also participated in regular group therapy while hospitalized. Coping skills, problem solving as well as relaxation therapies were also part of the unit programming.   Labs were reviewed with the patient, and abnormal results were discussed with the patient. CMP WNL, CBC WNL, tox screen negative, BAL 265.  EKG with QTc 397.  Labs ordered:  Hemoglobin A1c WNL, TSH WNL, lipid panel WNL, baseline UA WNL, vitamins B1 WNL. Vit D WNL. Vit B12 low at 138. Supplementing.    The patient is able to verbalize their individual safety plan to this provider.   # It is recommended to the patient to continue psychiatric medications as prescribed, after discharge from the hospital.     # It is recommended to the patient to follow up with your outpatient psychiatric provider and PCP. Pt has been educated to present to his PCP's office in 3 days (05/29/22)  or to go to urgent care in 3 days to have the sutures to his left wrist removed.   # It was discussed with the patient, the impact of alcohol, drugs, tobacco have been there overall psychiatric and medical wellbeing, and total abstinence from substance use was recommended the patient.ed.   # Prescriptions provided or sent directly to preferred pharmacy at  discharge. Patient agreeable to plan. Given opportunity to ask questions. Appears to feel comfortable with discharge.    # In the event of worsening symptoms, the patient is instructed to call the crisis hotline, 911 and or go to the nearest ED for appropriate evaluation and treatment of symptoms. To follow-up with primary care provider for other medical issues, concerns and or health care needs   # Patient was discharged home with a plan to follow up as noted below.    Physical Findings: AIMS:0 CIWA:  CIWA-Ar Total: 0 COWS:n/a    Musculoskeletal: Strength & Muscle Tone: within normal limits Gait & Station: normal Patient leans: N/A  Psychiatric Specialty Exam:  Presentation  General Appearance:  Appropriate for Environment; Fairly Groomed  Eye Contact: Good  Speech: Clear and Coherent  Speech Volume: Normal  Handedness: Right  Mood and Affect  Mood: Euthymic  Affect: Appropriate; Congruent  Thought Process  Thought Processes: Coherent  Descriptions of Associations:Intact  Orientation:Full (Time, Place and Person)  Thought Content:Logical  History of Schizophrenia/Schizoaffective disorder:No data recorded Duration of Psychotic Symptoms:No data recorded Hallucinations:Hallucinations: None  Ideas of Reference:None  Suicidal Thoughts:Suicidal Thoughts: No  Homicidal Thoughts:Homicidal Thoughts: No  Sensorium  Memory: Immediate Good  Judgment: Good  Insight: Good  Executive Functions  Concentration: Good  Attention Span: Good  Recall: Good  Fund of Knowledge: Good  Language: Good  Psychomotor Activity  Psychomotor Activity: Psychomotor Activity: Normal  Assets  Assets: Communication Skills  Sleep  Sleep: Sleep: Good  Physical Exam: Physical Exam Constitutional:      Appearance: Normal appearance.  HENT:     Head: Normocephalic.     Nose: Nose normal. No congestion.  Eyes:     Pupils: Pupils are equal, round, and  reactive to light.  Pulmonary:     Effort: Pulmonary effort is normal.  Musculoskeletal:        General: Normal range of motion.     Cervical back: Normal range of motion.  Neurological:     General: No focal deficit present.     Mental Status: He is alert and oriented to person, place, and time.    Review of Systems  Constitutional: Negative.   HENT: Negative.    Eyes: Negative.   Respiratory: Negative.    Cardiovascular: Negative.   Gastrointestinal: Negative.   Genitourinary: Negative.   Musculoskeletal: Negative.   Skin: Negative.  Negative for rash.  Neurological: Negative.   Psychiatric/Behavioral:  Positive for depression (Denies SI/HI/AVH, denies paranoia, verbally contracts for safety outside of this [facility]) and substance abuse (Educated on  the negative impact of alcohol on his mental health and verbalizes understanding). Negative for hallucinations, memory loss and suicidal ideas. The patient is nervous/anxious (resolving). The patient does not have insomnia.    Blood pressure (!) 137/90, pulse 81, temperature 98.3 F (36.8 C), temperature source Oral, resp. rate 16, height 5\' 11"  (1.803 m), weight 77.1 kg, SpO2 98 %. Body mass index is 23.71 kg/m.   Social History   Tobacco Use  Smoking Status Every Day   Packs/day: 0.50   Types: Cigarettes   Passive exposure: Never  Smokeless Tobacco Former   Tobacco Cessation:  A prescription for an FDA-approved tobacco cessation medication provided at discharge   Blood Alcohol level:  Lab Results  Component Value Date   ETH 265 (H) 05/18/2022   ETH 212 (H) 69/67/8938    Metabolic Disorder Labs:  Lab Results  Component Value Date   HGBA1C 4.5 (L) 05/20/2022   MPG 82.45 05/20/2022   MPG 94 12/30/2013   Lab Results  Component Value Date   PROLACTIN 12.3 12/30/2013   Lab Results  Component Value Date   CHOL 194 05/20/2022   TRIG 94 05/20/2022   HDL 102 05/20/2022   CHOLHDL 1.9 05/20/2022   VLDL 19  05/20/2022   LDLCALC 73 05/20/2022   [place] 75 12/30/2013   See Psychiatric Specialty Exam and Suicide Risk Assessment completed by Attending Physician prior to discharge.  Discharge destination:  Home  Is patient on multiple antipsychotic therapies at discharge:  No   Has Patient had three or more failed trials of antipsychotic monotherapy by history:  No  Recommended Plan for Multiple Antipsychotic Therapies: NA  Allergies as of 05/23/2022       Reactions   Aspirin Shortness Of Breath   Nsaids Shortness Of Breath, Swelling, Other (See Comments)   possible angioedema and SOB 11/27/07   Penicillins Shortness Of Breath   Tolmetin Anaphylaxis, Shortness Of Breath, Swelling, Other (See Comments)   Possible angioedema and SOB 11/27/07   Amoxicillin Other (See Comments)   noted on allergy report that is penicillin and aspirin allergic   Cephalosporins Other (See Comments)   ? reaction to Ceftin(cefuxoxime)  12-07---admitted for anaphylaxis        Medication List     STOP taking these medications    budesonide-formoterol 160-4.5 MCG/ACT inhaler Commonly known as: Symbicort   montelukast 10 MG tablet Commonly known as: Singulair       TAKE these medications      Indication  albuterol 108 (90 Base) MCG/ACT inhaler Commonly known as: ProAir HFA Inhale two puffs every four to six hours as needed for cough or wheeze.  Indication: Asthma   amLODipine 5 MG tablet Commonly known as: NORVASC Take 1 tablet (5 mg total) by mouth daily. Start taking on: May 24, 2022  Indication: High Blood Pressure Disorder   cetirizine 10 MG tablet Commonly known as: ZYRTEC Take 1 tablet (10 mg total) by mouth daily.  Indication: Hayfever   cyanocobalamin 500 MCG tablet Commonly known as: VITAMIN B12 Take 1 tablet (500 mcg total) by mouth daily. Start taking on: May 24, 2022  Indication: Inadequate Vitamin B12   fluticasone 50 MCG/ACT nasal spray Commonly known as:  FLONASE Use one spray in each nostril twice daily to prevent runny nose and congestion.  Indication: Allergic Rhinitis   nicotine 21 mg/24hr patch Commonly known as: NICODERM CQ - dosed in mg/24 hours Place 1 patch (21 mg total) onto the skin daily. Start taking on:  May 24, 2022  Indication: Nicotine Addiction   sertraline 100 MG tablet Commonly known as: ZOLOFT Take 1 tablet (100 mg total) by mouth daily. Start taking on: May 24, 2022  Indication: Major Depressive Disorder        Follow-up Information     [facility]. Go to.   Specialty: Behavioral Health Why: Please go to this provider for an assessment, to obtain therapy and medication management services on Monday through Friday, arrive by 7:20 am. Assessments are provided on a first come, first served basis. Contact information: [address] [place] [address] [phone]               Signed: Starleen Blue, NP 05/23/2022, 2:14 PM

## 2022-05-23 NOTE — Progress Notes (Signed)
  [facility] Adult Case Management Discharge Plan :  Will you be returning to the same living situation after discharge:  Yes,  Patient will be returning back to his Dad house  At discharge, do you have transportation home?: Yes,  Patient dad will be picking him up today Do you have the ability to pay for your medications: Yes,  patient says that he has a temporary insurance card through his job  Release of information consent forms completed and in the chart;  Patient's signature needed at discharge.  Patient to Follow up at:  [place]. Go to.   Specialty: Behavioral Health Why: Please go to this provider for an assessment, to obtain therapy and medication management services on Monday through Friday, arrive by 7:20 am. Assessments are provided on a first come, first served basis. Contact information: [place] [phone]                Next level of care provider has access to [facility] and Suicide Prevention discussed: Yes,  Edwing Figley III, father, [phone]     Has patient been referred to the Quitline?: Patient refused referral  Patient has been referred for addiction treatment: N/A  Sherre Lain, LCSWA 05/23/2022, 9:46 AM

## 2022-05-23 NOTE — BHH Group Notes (Signed)
Adult Psychoeducational Group Note  Date:  05/23/2022 Time:  9:27 AM  Group Topic/Focus:  Goals Group:   The focus of this group is to help patients establish daily goals to achieve during treatment and discuss how the patient can incorporate goal setting into their daily lives to aide in recovery.  Participation Level:  Active  Additional Comments:  There wasn't a formal group today due to staffing, but I was able to get a 1:1 goals review with the pt. The patient's goal was to get a discharge plan.   Elfa Wooton T Ria Comment 05/23/2022, 9:27 AM

## 2022-05-23 NOTE — Progress Notes (Signed)
Pt was educated on discharge. Pt was given discharge papers. Copy of safety plan placed in chart. Pt was satisfied all belongings were returned. Pt was discharged to lobby.  

## 2022-05-23 NOTE — Progress Notes (Addendum)
   05/22/22 2229  Psych Admission Type (Psych Patients Only)  Admission Status Involuntary  Psychosocial Assessment  Patient Complaints None  Eye Contact Fair  Facial Expression Sad  Affect Appropriate to circumstance  Speech Logical/coherent;Soft  Interaction Assertive  Motor Activity Other (Comment) (WDL)  Appearance/Hygiene Unremarkable  Behavior Characteristics Cooperative;Appropriate to situation  Mood Pleasant  Thought Process  Coherency WDL  Content WDL  Delusions None reported or observed  Perception WDL  Hallucination None reported or observed  Judgment Limited  Confusion None  Danger to Self  Current suicidal ideation? Denies  Danger to Others  Danger to Others None reported or observed   Patient alert and oriented. Presenting appropriate to circumstance and pleasant. Patient denies SI, HI, AVH, and pain. Patient denies anxiety and depression at this time. Support and encouragement provided. Routine safety checks conducted every 15 minutes. Patient verbally contracts for safety and remains safe on the unit.

## 2022-05-23 NOTE — [facility] Suicide Risk Assessment (Signed)
Suicide Risk Assessment  Discharge Assessment    [facility] Discharge Suicide Risk Assessment   Principal Problem: MDD (major depressive disorder), recurrent severe, without psychosis ([place]) Discharge Diagnoses: Principal Problem:   MDD (major depressive disorder), recurrent severe, without psychosis ([place]) Active Problems:   Severe alcohol dependence ([place])   Anxiety state   Insomnia   History of asthma   Nicotine dependence  Reason For Admission: Ethan Beasley is a 22 yo male with a mental health history of MDD & alcohol use disorder who made cuts to his left wrist with a paring knife during an argument with his family in the context of alcohol intoxication.  He was  transported to the [employer] , was [employer] and transported by the [facility] police department to this [facility]) for treatment and stabilization of his mood.                                             HOSPITAL COURSE During the patient's hospitalization, patient had extensive initial psychiatric evaluation, and follow-up psychiatric evaluations every day. Psychiatric diagnoses provided upon initial assessment are as listed above. Patient's psychiatric medications were adjusted on admission as follows: -Start Zoloft 50 mg on 05/20/2021 for depressive symptoms -Start Claritin 10 mg daily for seasonal allergic rhinitis. -Start albuterol inhaler as needed for wheezing or shortness of breath -Start nicotine patch daily for nicotine dependence -Start hydroxyzine 25 mg every 6 hours PRNAnxiety/CIWA < or =10 -Start Ativan detox protocol for alcohol use disorder -Continue trazodone 50 mg nightly as needed for sleep  During the hospitalization, other adjustments were made to the patient's psychiatric medication regimen. Pt has successfully completed the Ativan detox protocol for alcohol abuse and currently does not have any alcohol related withdrawal symptoms. Medications at discharge are as follows:  -Continue  Zoloft 100 mg for depressive symptoms -Continue Norvasc 5 mg daily for htn -Was taking Claritin 10 mg daily for seasonal allergic rhinitis during this hospitalization, but may resume taking Zyrtec which he states is more helpful once discharged. -Continue albuterol inhaler as needed for wheezing or shortness of breath -Continue nicotine patch daily for nicotine dependence -Continue Vitamin B12 for Vitamin B12 deficiency  Pt has not required Trazodone during this hospitalization, and is not being discharged on this medication. Not also being discharged with Hydroxyzine as he has only used it once.  Patient's care was discussed during the interdisciplinary team meeting every day during the hospitalization. The patient denies having side effects to prescribed psychiatric medication. Gradually, patient started adjusting to milieu. The patient was evaluated each day by a clinical provider to ascertain response to treatment. Improvement was noted by the patient's report of decreasing symptoms, improved sleep and appetite, affect, medication tolerance, behavior, and participation in unit programming.  Patient was asked each day to complete a self inventory noting mood, mental status, pain, new symptoms, anxiety and concerns.    Symptoms were reported as significantly decreased or resolved completely by discharge.  On day of discharge, the patient reports that their mood is stable. The patient denied having suicidal thoughts for more than 48 hours prior to discharge.  Patient denies having homicidal thoughts.  Patient denies having auditory hallucinations.  Patient denies any visual hallucinations or other symptoms of psychosis. The patient was motivated to continue taking medication with a goal of continued improvement in mental health.  The patient reports their target psychiatric symptoms of depression, anxiety, insomnia and alcohol withdrawal related symptoms responded well to the psychiatric medications,  and the patient reports overall benefit from this psychiatric hospitalization. Supportive psychotherapy was provided to the patient. The patient also participated in regular group therapy while hospitalized. Coping skills, problem solving as well as relaxation therapies were also part of the unit programming.  Labs were reviewed with the patient, and abnormal results were discussed with the patient. CMP WNL, CBC WNL, tox screen negative, BAL 265.  EKG with QTc 397.  Labs ordered: Hemoglobin A1c WNL, TSH WNL, lipid panel WNL, baseline UA WNL, vitamins B1 WNL. Vit D WNL. Vit B12 low at 138. Supplementing.   The patient is able to verbalize their individual safety plan to this provider.  # It is recommended to the patient to continue psychiatric medications as prescribed, after discharge from the hospital.    # It is recommended to the patient to follow up with your outpatient psychiatric provider and PCP. Pt has been educated to present to his PCP's office in 3 days (05/29/22)  or to go to urgent care in 3 days to have the sutures to his left wrist removed.  # It was discussed with the patient, the impact of alcohol, drugs, tobacco have been there overall psychiatric and medical wellbeing, and total abstinence from substance use was recommended the patient.ed.  # Prescriptions provided or sent directly to preferred pharmacy at discharge. Patient agreeable to plan. Given opportunity to ask questions. Appears to feel comfortable with discharge.    # In the event of worsening symptoms, the patient is instructed to call the crisis hotline, 911 and or go to the nearest ED for appropriate evaluation and treatment of symptoms. To follow-up with primary care provider for other medical issues, concerns and or health care needs  # Patient was discharged home with a plan to follow up as noted below.   Total Time spent with patient: 30 minutes  Musculoskeletal: Strength & Muscle Tone: within normal  limits Gait & Station: normal Patient leans: N/A  Psychiatric Specialty Exam  Presentation  General Appearance:  Appropriate for Environment; Fairly Groomed  Eye Contact: Good  Speech: Clear and Coherent  Speech Volume: Normal  Handedness: Right  Mood and Affect  Mood: Euthymic  Duration of Depression Symptoms: No data recorded Affect: Appropriate; Congruent   Thought Process  Thought Processes: Coherent  Descriptions of Associations:Intact  Orientation:Full (Time, Place and Person)  Thought Content:Logical  History of Schizophrenia/Schizoaffective disorder:No data recorded Duration of Psychotic Symptoms:No data recorded Hallucinations:Hallucinations: None  Ideas of Reference:None  Suicidal Thoughts:Suicidal Thoughts: No  Homicidal Thoughts:Homicidal Thoughts: No  Sensorium  Memory: Immediate Good  Judgment: Good  Insight: Good  Executive Functions  Concentration: Good  Attention Span: Good  Recall: Good  Fund of Knowledge: Good  Language: Good  Psychomotor Activity  Psychomotor Activity: Psychomotor Activity: Normal  Assets  Assets: Communication Skills  Sleep  Sleep: Sleep: Good  Physical Exam: Physical Exam Constitutional:      Appearance: Normal appearance.  HENT:     Head: Normocephalic.     Nose: Nose normal.  Eyes:     Pupils: Pupils are equal, round, and reactive to light.  Musculoskeletal:        General: Normal range of motion.     Cervical back: Normal range of motion.  Neurological:     Mental Status: He is alert and oriented to person, place, and time.  Review of Systems  Constitutional: Negative.   HENT: Negative.    Eyes: Negative.   Respiratory: Negative.    Cardiovascular: Negative.   Gastrointestinal: Negative.   Genitourinary: Negative.   Skin: Negative.   Neurological: Negative.   Psychiatric/Behavioral:  Positive for depression (significantly reduced. Denies SI/HI/AVH, verbally  contracts for safety outside of this [place]) and substance abuse. Negative for hallucinations, memory loss and suicidal ideas. The patient is not nervous/anxious and does not have insomnia.    Blood pressure (!) 137/90, pulse 81, temperature 98.3 F (36.8 C), temperature source Oral, resp. rate 16, height 5\' 11"  (1.803 m), weight 77.1 kg, SpO2 98 %. Body mass index is 23.71 kg/m.  Mental Status Per Nursing Assessment::   On Admission:  NA  Demographic Factors:  Male  Loss Factors: NA  Historical Factors: Family history of mental illness or substance abuse  Risk Reduction Factors:   Employed, Living with another person, especially a relative, Positive social support, and Positive therapeutic relationship  Continued Clinical Symptoms:  Alcohol/Substance Abuse/Dependencies  Cognitive Features That Contribute To Risk:  None    Suicide Risk:  Mild:  There are no identifiable suicide plans, no associated intent, mild dysphoria and related symptoms, good self-control (both objective and subjective assessment), few other risk factors, and identifiable protective factors, including available and accessible social support.   Follow-up Information     [facility]. Go to.   Specialty: Behavioral Health Why: Please go to this provider for an assessment, to obtain therapy and medication management services on Monday through Friday, arrive by 7:20 am. Assessments are provided on a first come, first served basis. Contact information: [address] [place] [phone]               [phone], NP 05/23/2022, 9:53 AM

## 2022-05-23 NOTE — Progress Notes (Signed)
   05/22/22 2136  Vitals  BP (!) 155/102  MAP (mmHg) 117  BP Location Right Arm  BP Method Automatic  Patient Position (if appropriate) Standing  Pulse Rate 100  Pulse Rate Source Monitor  Oxygen Therapy  SpO2 96 %   Patient BP elevated, no acute distress noted at this time, patient denies associated cardiac symptoms. [place] NP notified, RN to recheck BP manually. Manual BP 140/88, reported to [place], NP.

## 2022-05-23 NOTE — Progress Notes (Signed)
   05/22/22 2232  Vitals  BP (!) 137/90  MAP (mmHg) 104  BP Method Automatic  Pulse Rate 81  Pulse Rate Source Monitor  Oxygen Therapy  SpO2 98 %   Patient BP recheck automatic.

## 2022-06-12 ENCOUNTER — Encounter (HOSPITAL_COMMUNITY): Payer: Self-pay | Admitting: Emergency Medicine

## 2022-06-12 ENCOUNTER — Ambulatory Visit (HOSPITAL_COMMUNITY)
Admission: EM | Admit: 2022-06-12 | Discharge: 2022-06-12 | Disposition: A | Discharge status: Home / Self Care | Payer: Self-pay

## 2022-06-12 DIAGNOSIS — S61512D Laceration without foreign body of left wrist, subsequent encounter: Secondary | ICD-10-CM

## 2022-06-12 DIAGNOSIS — Z4802 Encounter for removal of sutures: Secondary | ICD-10-CM

## 2022-06-12 DIAGNOSIS — S61512A Laceration without foreign body of left wrist, initial encounter: Secondary | ICD-10-CM

## 2022-06-12 DIAGNOSIS — Z5189 Encounter for other specified aftercare: Secondary | ICD-10-CM

## 2022-06-12 NOTE — ED Provider Notes (Signed)
[facility]    CSN: [id number] Arrival date & time: 06/12/22  1615      History   Chief Complaint Chief Complaint  Patient presents with   Suture / Staple Removal    HPI Ethan Beasley is a 22 y.o. male.   22 year old male pt, Ethan Beasley, presents to urgent care with chief complaint of needing to have sutures removed from left wrist hand area.  Laceration was repaired on 05/17/2022, patient states he was not told when to return so he is here for evaluation.  The history is provided by the patient. No language interpreter was used.    Past Medical History:  Diagnosis Date   Asthma    prn inhaler   History of concussion 2014   Hydrocele, left 11/2014    Patient Active Problem List   Diagnosis Date Noted   Visit for wound check 06/12/2022   Encounter for removal of sutures 06/12/2022   MDD (major depressive disorder), recurrent severe, without psychosis ([place]) 05/19/2022   Severe alcohol dependence ([place]) 05/19/2022   Anxiety state 05/19/2022   Insomnia 05/19/2022   History of asthma 05/19/2022   Nicotine dependence 05/19/2022   Deliberate self-cutting 05/18/2022   Substance induced mood disorder ([place]) 05/28/2020   Major depressive disorder without psychotic features 05/28/2020   Suicidal ideation 12/30/2013   MDD (major depressive disorder), single episode, severe ([place]) 12/29/2013   Non-suicidal depressed mood 07/22/2013   Outbursts of anger 07/22/2013   Circadian rhythm sleep disorder, irregular sleep wake type 07/22/2013   Postconcussion syndrome 02/20/2013    Past Surgical History:  Procedure Laterality Date   EXCISION OF SKIN TAG Bilateral 12/10/2003   preauricular   HYDROCELE EXCISION Left 12/23/2014   Procedure: LEFT HYDROCELE REPAIR;  Surgeon: Gerald Stabs, MD;  Location: [facility];  Service: Pediatrics;  Laterality: Left;   TONSILLECTOMY AND ADENOIDECTOMY  12/10/2003       Home Medications    Prior to Admission  medications   Medication Sig Start Date End Date Taking? Authorizing Provider  albuterol (PROAIR HFA) 108 (90 Base) MCG/ACT inhaler Inhale two puffs every four to six hours as needed for cough or wheeze. Patient not taking: Reported on 05/18/2022 08/15/16   Jiles Prows, MD  amLODipine (NORVASC) 5 MG tablet Take 1 tablet (5 mg total) by mouth daily. 05/24/22   Nicholes Rough, NP  cetirizine (ZYRTEC) 10 MG tablet Take 1 tablet (10 mg total) by mouth daily. Patient not taking: Reported on 05/18/2022 08/15/16   Jiles Prows, MD  cyanocobalamin (VITAMIN B12) 500 MCG tablet Take 1 tablet (500 mcg total) by mouth daily. 05/24/22   Nicholes Rough, NP  fluticasone (FLONASE) 50 MCG/ACT nasal spray Use one spray in each nostril twice daily to prevent runny nose and congestion. Patient not taking: Reported on 05/18/2022 08/15/16   Jiles Prows, MD  nicotine (NICODERM CQ - DOSED IN MG/24 HOURS) 21 mg/24hr patch Place 1 patch (21 mg total) onto the skin daily. 05/24/22   Nicholes Rough, NP  sertraline (ZOLOFT) 100 MG tablet Take 1 tablet (100 mg total) by mouth daily. 05/24/22   Nicholes Rough, NP    Family History Family History  Problem Relation Age of Onset   Hypertension Father    Sarcoidosis Father     Social History Social History   Tobacco Use   Smoking status: Every Day    Packs/day: 0.50    Types: Cigarettes    Passive exposure:  Never   Smokeless tobacco: Former  Scientific laboratory technician Use: Never used  Substance Use Topics   Alcohol use: Yes    Alcohol/week: 35.0 standard drinks of alcohol    Types: 35 Cans of beer per week    Comment: 1-3 40oz malt liquer 4-6x weekly   Drug use: No     Allergies   Aspirin, Nsaids, Penicillins, Tolmetin, Amoxicillin, and Cephalosporins   Review of Systems Review of Systems  Skin:  Positive for wound.  All other systems reviewed and are negative.    Physical Exam Triage Vital Signs ED Triage Vitals  Enc Vitals Group     BP 06/12/22 1728  (!) 138/98     Pulse Rate 06/12/22 1728 75     Resp 06/12/22 1728 14     Temp 06/12/22 1728 98.2 F (36.8 C)     Temp src --      SpO2 06/12/22 1728 97 %     Weight --      Height --      Head Circumference --      Peak Flow --      Pain Score 06/12/22 1727 6     Pain Loc --      Pain Edu? --      Excl. in [place]? --    No data found.  Updated Vital Signs BP (!) 138/98 (BP Location: Right Arm)   Pulse 75   Temp 98.2 F (36.8 C)   Resp 14   SpO2 97%   Visual Acuity Right Eye Distance:   Left Eye Distance:   Bilateral Distance:    Right Eye Near:   Left Eye Near:    Bilateral Near:     Physical Exam Vitals and nursing note reviewed.  Constitutional:      General: He is not in acute distress.    Appearance: He is well-developed.  HENT:     Head: Normocephalic and atraumatic.  Eyes:     Conjunctiva/sclera: Conjunctivae normal.  Cardiovascular:     Rate and Rhythm: Normal rate and regular rhythm.     Heart sounds: No murmur heard. Pulmonary:     Effort: Pulmonary effort is normal. No respiratory distress.     Breath sounds: Normal breath sounds.  Abdominal:     Palpations: Abdomen is soft.     Tenderness: There is no abdominal tenderness.  Musculoskeletal:        General: No swelling.     Cervical back: Neck supple.  Skin:    General: Skin is warm and dry.     Capillary Refill: Capillary refill takes less than 2 seconds.     Findings: Signs of injury, laceration and wound present.     Comments: 2 well healed lacerations left hand/wrist dorsal aspect . 9 sutures removed, skin remains intact  Neurological:     Mental Status: He is alert.  Psychiatric:        Mood and Affect: Mood normal.      UC Treatments / Results  Labs (all labs ordered are listed, but only abnormal results are displayed) Labs Reviewed - No data to display  EKG   Radiology No results found.  Procedures Procedures (including critical care time)  Medications Ordered in  UC Medications - No data to display  Initial Impression / Assessment and Plan / UC Course  I have reviewed the triage vital signs and the nursing notes.  Pertinent labs & imaging results that were available during  my care of the patient were reviewed by me and considered in my medical decision making (see chart for details).     DDX: Wound check, suture removal Final Clinical Impressions(s) / UC Diagnoses   Final diagnoses:  Visit for wound check  Encounter for removal of sutures     Discharge Instructions      Keep wound clean and dry. Follow up with PCP as needed.      ED Prescriptions   None    PDMP not reviewed this encounter.   Tori Milks, NP [id number]

## 2022-06-12 NOTE — ED Triage Notes (Signed)
Pt reports that around 1/18 or 19th had sutures put in left wrist that was lacerated. Reports wasn't told when to go get sutures out.  Denies drainage or s/s of infection. Reports some pain with movement of wrist.

## 2022-06-12 NOTE — Discharge Instructions (Signed)
Keep wound clean and dry. Follow up with PCP as needed.

## 2022-06-26 ENCOUNTER — Ambulatory Visit (HOSPITAL_COMMUNITY): Payer: Self-pay | Admitting: Clinical

## 2022-07-01 DIAGNOSIS — S61512A Laceration without foreign body of left wrist, initial encounter: Secondary | ICD-10-CM

## 2023-10-15 ENCOUNTER — Ambulatory Visit (HOSPITAL_COMMUNITY)
Admission: EM | Admit: 2023-10-15 | Discharge: 2023-10-15 | Disposition: A | Discharge status: Home / Self Care | Payer: PRIVATE HEALTH INSURANCE | Attending: Family Medicine | Admitting: Family Medicine

## 2023-10-15 ENCOUNTER — Encounter (HOSPITAL_COMMUNITY): Payer: Self-pay

## 2023-10-15 DIAGNOSIS — R112 Nausea with vomiting, unspecified: Secondary | ICD-10-CM | POA: Diagnosis not present

## 2023-10-15 DIAGNOSIS — R197 Diarrhea, unspecified: Secondary | ICD-10-CM | POA: Insufficient documentation

## 2023-10-15 DIAGNOSIS — R1010 Upper abdominal pain, unspecified: Secondary | ICD-10-CM | POA: Insufficient documentation

## 2023-10-15 LAB — CBC
HCT: 42.7 % (ref 39.0–52.0)
Hemoglobin: 14.7 g/dL (ref 13.0–17.0)
MCH: 33.7 pg (ref 26.0–34.0)
MCHC: 34.4 g/dL (ref 30.0–36.0)
MCV: 97.9 fL (ref 80.0–100.0)
Platelets: 240 10*3/uL (ref 150–400)
RBC: 4.36 MIL/uL (ref 4.22–5.81)
RDW: 13 % (ref 11.5–15.5)
WBC: 3.8 10*3/uL — ABNORMAL LOW (ref 4.0–10.5)
nRBC: 0 % (ref 0.0–0.2)

## 2023-10-15 LAB — COMPREHENSIVE METABOLIC PANEL WITH GFR
ALT: 61 U/L — ABNORMAL HIGH (ref 0–44)
AST: 106 U/L — ABNORMAL HIGH (ref 15–41)
Albumin: 4.1 g/dL (ref 3.5–5.0)
Alkaline Phosphatase: 55 U/L (ref 38–126)
Anion gap: 15 (ref 5–15)
BUN: <5 mg/dL — ABNORMAL LOW (ref 6–20)
CO2: 18 mmol/L — ABNORMAL LOW (ref 22–32)
Calcium: 8.4 mg/dL — ABNORMAL LOW (ref 8.9–10.3)
Chloride: 104 mmol/L (ref 98–111)
Creatinine, Ser: 0.94 mg/dL (ref 0.61–1.24)
GFR, Estimated: >60 mL/min (ref >60)
Glucose, Bld: 70 mg/dL (ref 70–99)
Potassium: 4.1 mmol/L (ref 3.5–5.1)
Sodium: 137 mmol/L (ref 135–145)
Total Bilirubin: 0.6 mg/dL (ref 0.0–1.2)
Total Protein: 6.8 g/dL (ref 6.5–8.1)

## 2023-10-15 MED ORDER — ONDANSETRON 4 MG PO TBDP: 4.0000 mg | ORAL_TABLET | Freq: Three times a day (TID) | ORAL | 0 refills | Status: AC | PRN

## 2023-10-15 MED ORDER — OMEPRAZOLE 40 MG PO CPDR: 40.0000 mg | DELAYED_RELEASE_CAPSULE | Freq: Every day | ORAL | 1 refills | Status: AC

## 2023-10-15 NOTE — ED Triage Notes (Signed)
 Pt states abdominal pain with N/V/D on and off for the past two weeks.  Pt states he drank a lot of alcohol last night.

## 2023-10-15 NOTE — ED Provider Notes (Signed)
 [facility]    CSN: [id number] Arrival date & time: 10/15/23  0809      History   Chief Complaint Chief Complaint  Patient presents with   Abdominal Pain    HPI Ethan Beasley is a 23 y.o. male.    Abdominal Pain Here for bilateral abdominal pain in the upper abdomen that has been going on for about 2 weeks, but feels worse in the last 2 days.  He has been having nausea and vomiting intermittently for maybe 2 months, and that is more persistent in the last 2 weeks.  He at times will throw up 3 times in a day and sometimes none.  He last threw up last night.  He is still nauseated.  He has also had diarrhea and runny stools for about 2 months.  He can go 2 to 3 days without a stool, but then will have diarrhea several days in a row.  He has been having about 3 loose stools daily for the last week.  No fever or chills and no URI symptoms with this.  He has not seen any blood in the emesis or in the stool, but maybe saw coffee grounds in the emesis a few months ago.  His stools look black but it is uncertain if it is looks like true melena.  No Pepto-Bismol intake.  He does drink about 80 ounces of alcohol nightly and states he did drink more yesterday.  He does smoke cigarettes.  He is allergic to penicillins and cephalosporins and NSAIDs and aspirin.    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   Severe alcohol dependence (HCC) 05/19/2022   Anxiety state 05/19/2022   Insomnia 05/19/2022   History of asthma 05/19/2022   Nicotine  dependence 05/19/2022   Deliberate self-cutting 05/18/2022    Substance induced mood disorder (HCC) 05/28/2020   Major depressive disorder without psychotic features 05/28/2020   Suicidal ideation 12/30/2013   MDD (major depressive disorder), single episode, severe (HCC) 12/29/2013   Non-suicidal depressed mood 07/22/2013   Outbursts of anger 07/22/2013   Circadian rhythm sleep disorder, 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 Authorizing Provider  omeprazole (PRILOSEC) 40 MG capsule Take 1 capsule (40 mg total) by mouth daily. 10/15/23  Yes Ann Keto, MD  ondansetron  (ZOFRAN -ODT) 4 MG disintegrating tablet Take 1 tablet (4 mg total) by mouth every 8 (eight) hours as needed for nausea or vomiting. 10/15/23  Yes Nazariah Cadet, Paige Boatman, MD  albuterol  (PROAIR  HFA) 108 (90 Base) MCG/ACT inhaler Inhale two puffs every four to six hours as needed for cough or wheeze. Patient not  taking: Reported on 05/18/2022 08/15/16   Fabienne Holter, MD  amLODipine  (NORVASC ) 5 MG tablet Take 1 tablet (5 mg total) by mouth daily. 05/24/22   Robet Chiquito, NP  cetirizine  (ZYRTEC ) 10 MG tablet Take 1 tablet (10 mg total) by mouth daily. Patient not taking: Reported on 05/18/2022 08/15/16   Kozlow, Eric J, MD  cyanocobalamin  (VITAMIN B12) 500 MCG tablet Take 1 tablet (500 mcg total) by mouth daily. 05/24/22   Robet Chiquito, NP  fluticasone  (FLONASE ) 50 MCG/ACT nasal spray Use one spray in each nostril twice daily to prevent runny nose and congestion. Patient not taking: Reported on 05/18/2022 08/15/16    Kozlow, Eric J, MD  nicotine  (NICODERM CQ  - DOSED IN MG/24 HOURS) 21 mg/24hr patch Place 1 patch (21 mg total) onto the skin daily. 05/24/22   Robet Chiquito, NP  sertraline  (ZOLOFT ) 100 MG tablet Take 1 tablet (100 mg total) by mouth daily. 05/24/22   Robet Chiquito, NP    Family History Family History  Problem Relation Age of Onset   Hypertension Father    Sarcoidosis Father     Social History Social History   Tobacco Use   Smoking status: Every Day    Current packs/day: 0.50    Types: Cigarettes    Passive exposure: Never   Smokeless tobacco: Former  Building services engineer status: Never Used  Substance Use Topics   Alcohol use: Yes    Alcohol/week: 35.0 standard drinks of alcohol    Types: 35 Cans of beer per week    Comment: 1-3 40oz malt liquer 4-6x weekly   Drug use: No     Allergies   Aspirin, Nsaids, Penicillins, Tolmetin, Amoxicillin, and Cephalosporins   Review of Systems Review of Systems  Gastrointestinal:  Positive for abdominal pain.     Physical Exam Triage Vital Signs ED Triage Vitals [10/15/23 0830]  Encounter Vitals Group     BP (!) 161/107     Girls Systolic BP Percentile      Girls Diastolic BP Percentile      Boys Systolic BP Percentile      Boys Diastolic BP Percentile      Pulse Rate 84     Resp 16     Temp 98.3 F (36.8 C)     Temp Source Oral     SpO2 93 %     Weight      Height      Head Circumference      Peak Flow      Pain Score 7     Pain Loc      Pain Education      Exclude from Growth Chart    No data found.  Updated Vital Signs BP (!) 161/107 (BP Location: Left Arm)   Pulse 84   Temp 98.3 F (36.8 C) (Oral)   Resp 16   SpO2 93%   Visual Acuity Right Eye Distance:   Left Eye Distance:   Bilateral Distance:    Right Eye Near:   Left Eye Near:    Bilateral Near:     Physical Exam Vitals reviewed.  Constitutional:      General: He is not in acute distress.    Appearance: He is not toxic-appearing.  HENT:      Nose: Nose normal.     Mouth/Throat:     Mouth: Mucous membranes are moist.     Pharynx: No oropharyngeal exudate or posterior oropharyngeal erythema.   Eyes:  Extraocular Movements: Extraocular movements intact.     Conjunctiva/sclera: Conjunctivae normal.     Pupils: Pupils are equal, round, and reactive to light.    Cardiovascular:     Rate and Rhythm: Normal rate and regular rhythm.     Heart sounds: No murmur heard. Pulmonary:     Effort: Pulmonary effort is normal. No respiratory distress.     Breath sounds: Normal breath sounds. No stridor. No wheezing or rhonchi.  Abdominal:     General: There is no distension.     Palpations: Abdomen is soft. There is no mass.     Tenderness: There is no guarding.     Comments: There is some very mild tenderness in the epigastrium, but no other tenderness discerned.  There is no hepatomegaly.   Musculoskeletal:     Cervical back: Neck supple.  Lymphadenopathy:     Cervical: No cervical adenopathy.   Skin:    Capillary Refill: Capillary refill takes less than 2 seconds.     Coloration: Skin is not jaundiced or pale.   Neurological:     General: No focal deficit present.     Mental Status: He is alert and oriented to person, place, and time.   Psychiatric:        Behavior: Behavior normal.      UC Treatments / Results  Labs (all labs ordered are listed, but only abnormal results are displayed) Labs Reviewed  COMPREHENSIVE METABOLIC PANEL WITH GFR  CBC    EKG   Radiology No results found.  Procedures Procedures (including critical care time)  Medications Ordered in UC Medications - No data to display  Initial Impression / Assessment and Plan / UC Course  I have reviewed the triage vital signs and the nursing notes.  Pertinent labs & imaging results that were available during my care of the patient were reviewed by me and considered in my medical decision making (see chart for details).     CBC and CMP  are drawn today and our staff will notify him if there is anything significantly abnormal.  Stool studies are ordered and he will collect the stool and bring it back here for further testing.  Staff will help him make the primary care appointment  Zofran  is provided for the nausea. Prilosec is sent in for potential gastritis, since he is tender in the epigastrium some.  He is advised to lessen or stop his alcohol intake.   Final Clinical Impressions(s) / UC Diagnoses   Final diagnoses:  Pain of upper abdomen  Nausea vomiting and diarrhea     Discharge Instructions      Ondansetron  dissolved in the mouth every 8 hours as needed for nausea or vomiting. Clear liquids(water, gatorade/pedialyte, ginger ale/sprite, chicken broth/soup) and bland things(crackers/toast, rice, potato, bananas) to eat. Avoid acidic foods like lemon/lime/orange/tomato, and avoid greasy/spicy foods.  Take omeprazole 40 mg--1 capsule daily for stomach acid.  We have drawn blood to check your blood counts and kidney and liver function and electrolytes.  Please collect the stool sample and bring back for testing.  Our staff will notify you if anything significantly abnormal on the blood work or the stool tests.  You can use the QR code/website at the back of the summary paperwork to schedule yourself a new patient appointment with primary care   It would be good to decrease or stop your alcohol intake.      ED Prescriptions     Medication Sig Dispense Auth. Provider  ondansetron  (ZOFRAN -ODT) 4 MG disintegrating tablet Take 1 tablet (4 mg total) by mouth every 8 (eight) hours as needed for nausea or vomiting. 20 tablet Ahaan Zobrist K, MD   omeprazole (PRILOSEC) 40 MG capsule Take 1 capsule (40 mg total) by mouth daily. 30 capsule Raymie Giammarco K, MD      PDMP not reviewed this encounter.   Ann Keto, MD 10/15/23 [phone]

## 2023-10-15 NOTE — Discharge Instructions (Addendum)
 Ondansetron  dissolved in the mouth every 8 hours as needed for nausea or vomiting. Clear liquids(water, gatorade/pedialyte, ginger ale/sprite, chicken broth/soup) and bland things(crackers/toast, rice, potato, bananas) to eat. Avoid acidic foods like lemon/lime/orange/tomato, and avoid greasy/spicy foods.  Take omeprazole 40 mg--1 capsule daily for stomach acid.  We have drawn blood to check your blood counts and kidney and liver function and electrolytes.  Please collect the stool sample and bring back for testing.  Our staff will notify you if anything significantly abnormal on the blood work or the stool tests.  You can use the QR code/website at the back of the summary paperwork to schedule yourself a new patient appointment with primary care   It would be good to decrease or stop your alcohol intake.

## 2023-10-16 ENCOUNTER — Ambulatory Visit (HOSPITAL_COMMUNITY): Payer: Self-pay

## 2023-12-26 ENCOUNTER — Ambulatory Visit: Payer: Self-pay | Admitting: Family Medicine

## 2024-01-02 ENCOUNTER — Ambulatory Visit: Payer: Self-pay | Admitting: Family Medicine
